# Patient Record
Sex: Male | Born: 1965 | Race: White | Hispanic: No | Marital: Married | Smoking: Former smoker
Health system: Southern US, Community
[De-identification: ages and names within clinical notes are randomized; demographics above are authoritative.]

## PROBLEM LIST (undated history)

## (undated) DIAGNOSIS — F17200 Nicotine dependence, unspecified, uncomplicated: Secondary | ICD-10-CM

## (undated) DIAGNOSIS — Q600 Renal agenesis, unilateral: Secondary | ICD-10-CM

## (undated) DIAGNOSIS — I10 Essential (primary) hypertension: Secondary | ICD-10-CM

## (undated) DIAGNOSIS — K4090 Unilateral inguinal hernia, without obstruction or gangrene, not specified as recurrent: Secondary | ICD-10-CM

## (undated) DIAGNOSIS — R7303 Prediabetes: Secondary | ICD-10-CM

## (undated) DIAGNOSIS — J4520 Mild intermittent asthma, uncomplicated: Secondary | ICD-10-CM

## (undated) DIAGNOSIS — F1020 Alcohol dependence, uncomplicated: Secondary | ICD-10-CM

## (undated) DIAGNOSIS — K760 Fatty (change of) liver, not elsewhere classified: Secondary | ICD-10-CM

## (undated) DIAGNOSIS — K219 Gastro-esophageal reflux disease without esophagitis: Secondary | ICD-10-CM

## (undated) DIAGNOSIS — F1094 Alcohol use, unspecified with alcohol-induced mood disorder: Secondary | ICD-10-CM

## (undated) DIAGNOSIS — E782 Mixed hyperlipidemia: Secondary | ICD-10-CM

---

## 2007-12-13 ENCOUNTER — Ambulatory Visit: Payer: Self-pay | Admitting: Family Medicine

## 2008-01-25 ENCOUNTER — Ambulatory Visit: Payer: Self-pay | Admitting: Internal Medicine

## 2008-02-25 ENCOUNTER — Ambulatory Visit: Payer: Self-pay | Admitting: Family Medicine

## 2008-03-20 ENCOUNTER — Emergency Department: Payer: Self-pay | Admitting: Emergency Medicine

## 2010-12-11 ENCOUNTER — Ambulatory Visit: Payer: Self-pay | Admitting: Gastroenterology

## 2011-01-15 ENCOUNTER — Ambulatory Visit: Payer: Self-pay | Admitting: Family Medicine

## 2011-06-11 ENCOUNTER — Ambulatory Visit: Payer: Self-pay | Admitting: Gastroenterology

## 2011-07-21 ENCOUNTER — Ambulatory Visit: Payer: Self-pay | Admitting: Gastroenterology

## 2011-07-24 LAB — PATHOLOGY REPORT

## 2011-09-08 ENCOUNTER — Ambulatory Visit: Payer: Self-pay

## 2011-11-28 IMAGING — US ABDOMEN ULTRASOUND
1 series · 17 of 25 positions shown · non-contrast
Comparison: none

REASON FOR EXAM: elevated LFT
COMMENTS:

[Series 1: abdomen ultrasound · 17 of 62 slices shown]
[im 1/62]
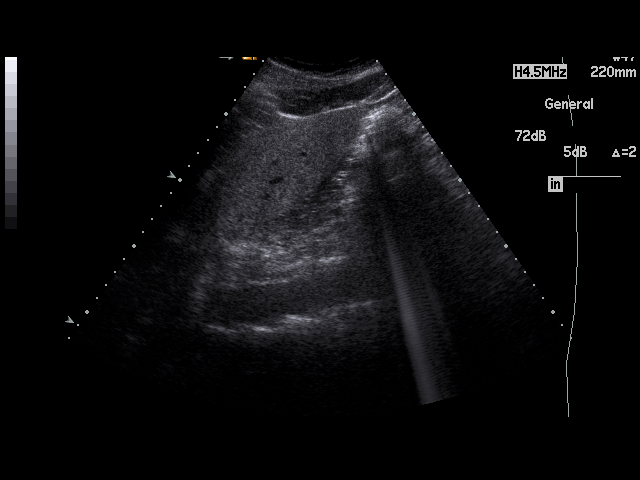
[im 6/62]
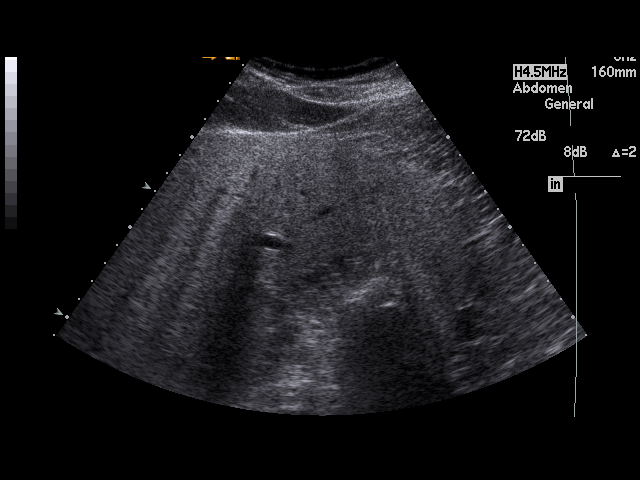
[im 8/62]
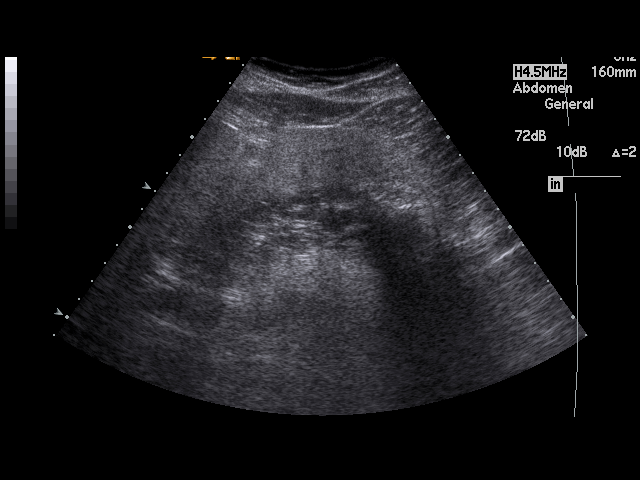
[im 13/62]
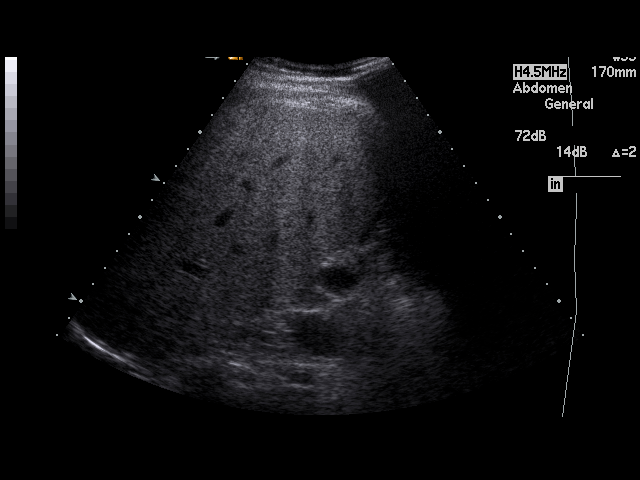
[im 16/62]
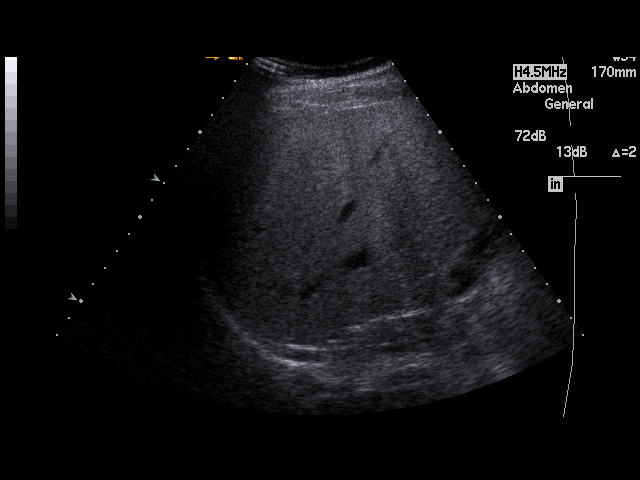
[im 21/62]
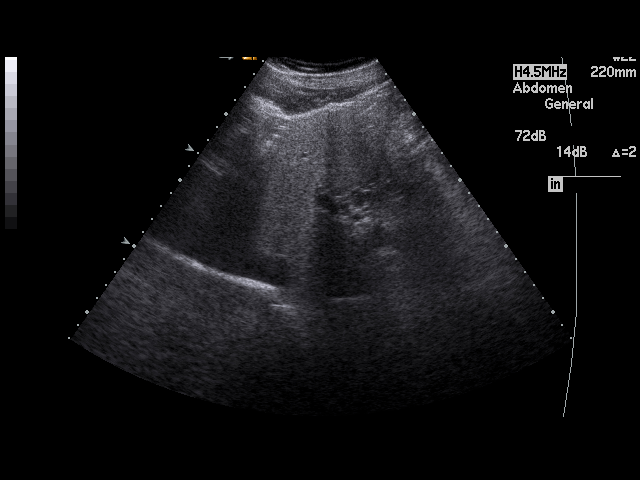
[im 23/62]
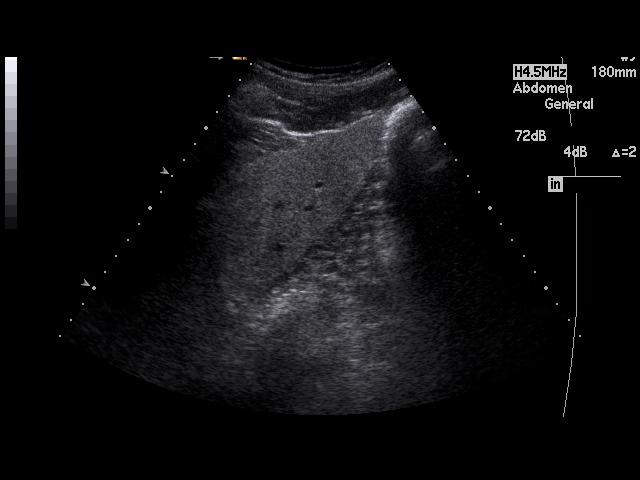
[im 28/62]
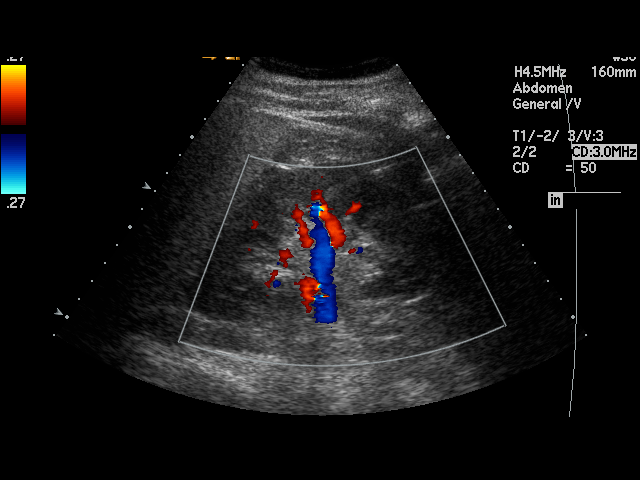
[im 31/62]
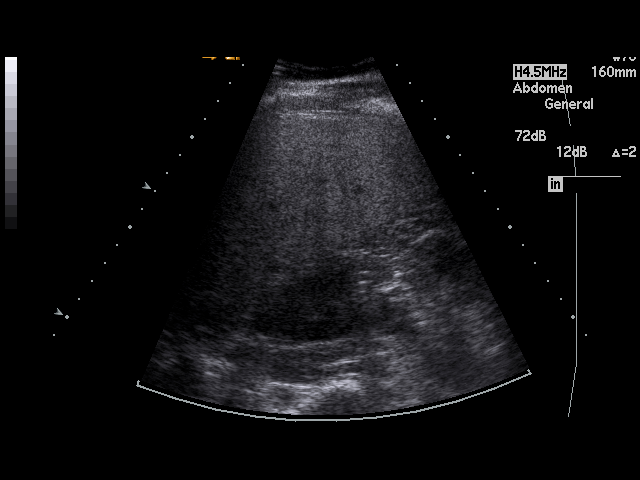
[im 34/62]
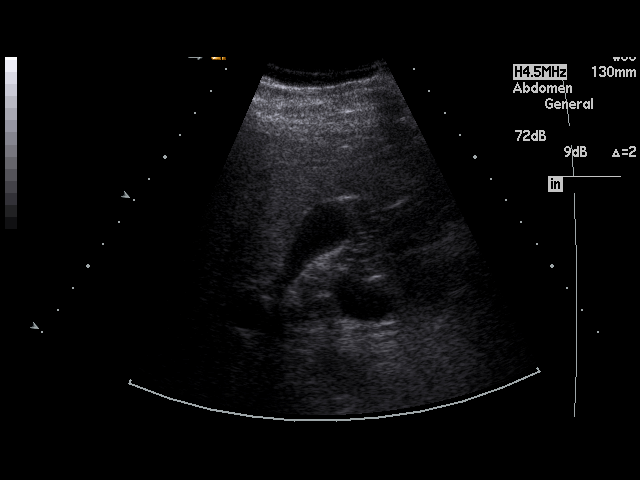
[im 39/62]
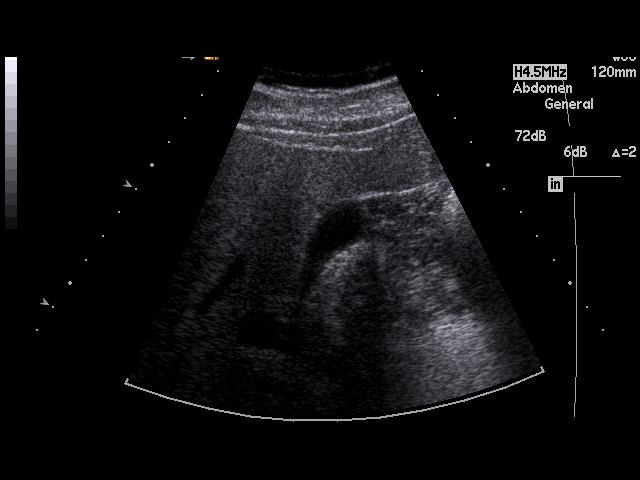
[im 41/62]
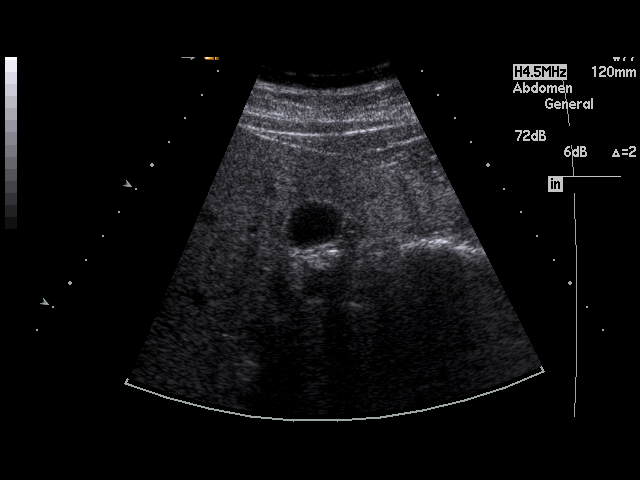
[im 46/62]
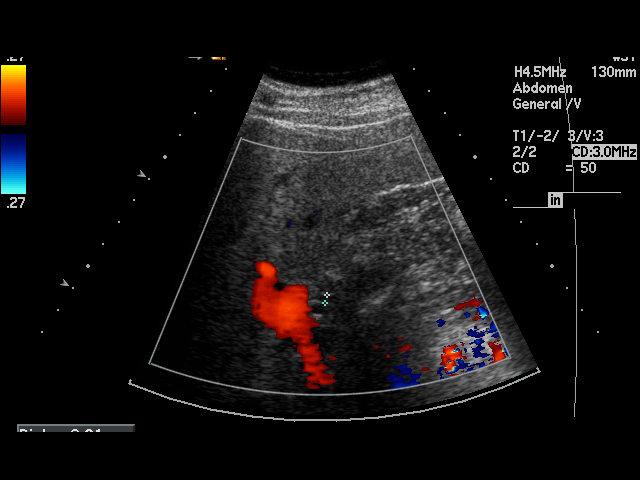
[im 49/62]
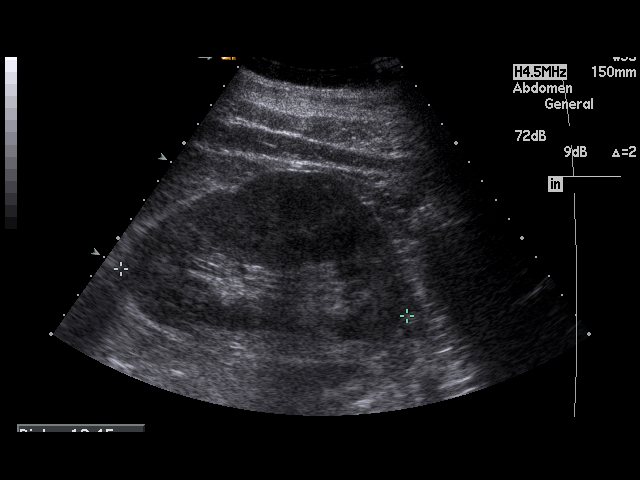
[im 54/62]
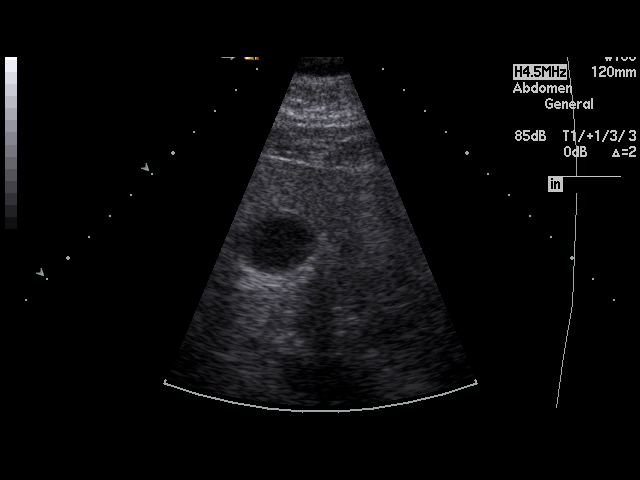
[im 56/62]
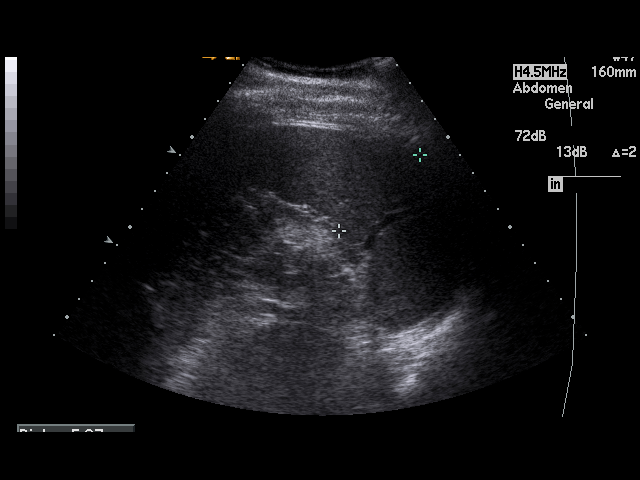
[im 62/62]
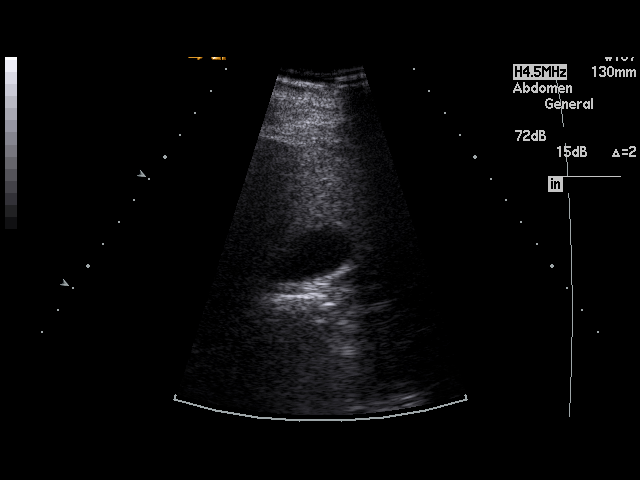

[17 of 25 positions shown; findings below may reference images not displayed]

PROCEDURE:     QUIRIJN - QUIRIJN ABDOMEN GENERAL SURVEY  - December 11, 2010 [DATE]

RESULT:     The liver is hyperechogenic, consistent with fatty infiltration.
No focal hepatic mass lesions are identified. The pancreas is not visualized
adequately for evaluation on this exam. The abdominal aorta and inferior
vena cava show no significant abnormalities. No gallstones are seen. There
is no thickening of the gallbladder wall. The common bile duct measures
mm in diameter which is within normal limits. The right kidney is visualized
and measures 12.79 cm sagittally. No hydronephrosis or other significant
abnormality of the right kidney is seen. The left kidney is not visualized
and reportedly is congenitally absent.
IMPRESSION: 1. No gallstones or other acute change is identified.
2. The left kidney is absent.

## 2012-01-02 IMAGING — CR DG SHOULDER 3+V*L*
1 series · 3 of 3 positions shown · non-contrast
Comparison: none

REASON FOR EXAM: PAINFUL
COMMENTS:

PROCEDURE:     MDR - MDR SHOULDER LEFT COMPLETE  - January 15, 2011 [DATE]
RESULT:     No fracture, dislocation or other acute bony abnormality is
identified. No lytic or blastic lesions are seen. No soft tissue
calcification is identified about the humeral head.

[Series 1: view not recorded · 0.17mm/px · 3 of 3 slices shown]
[im 1/3]
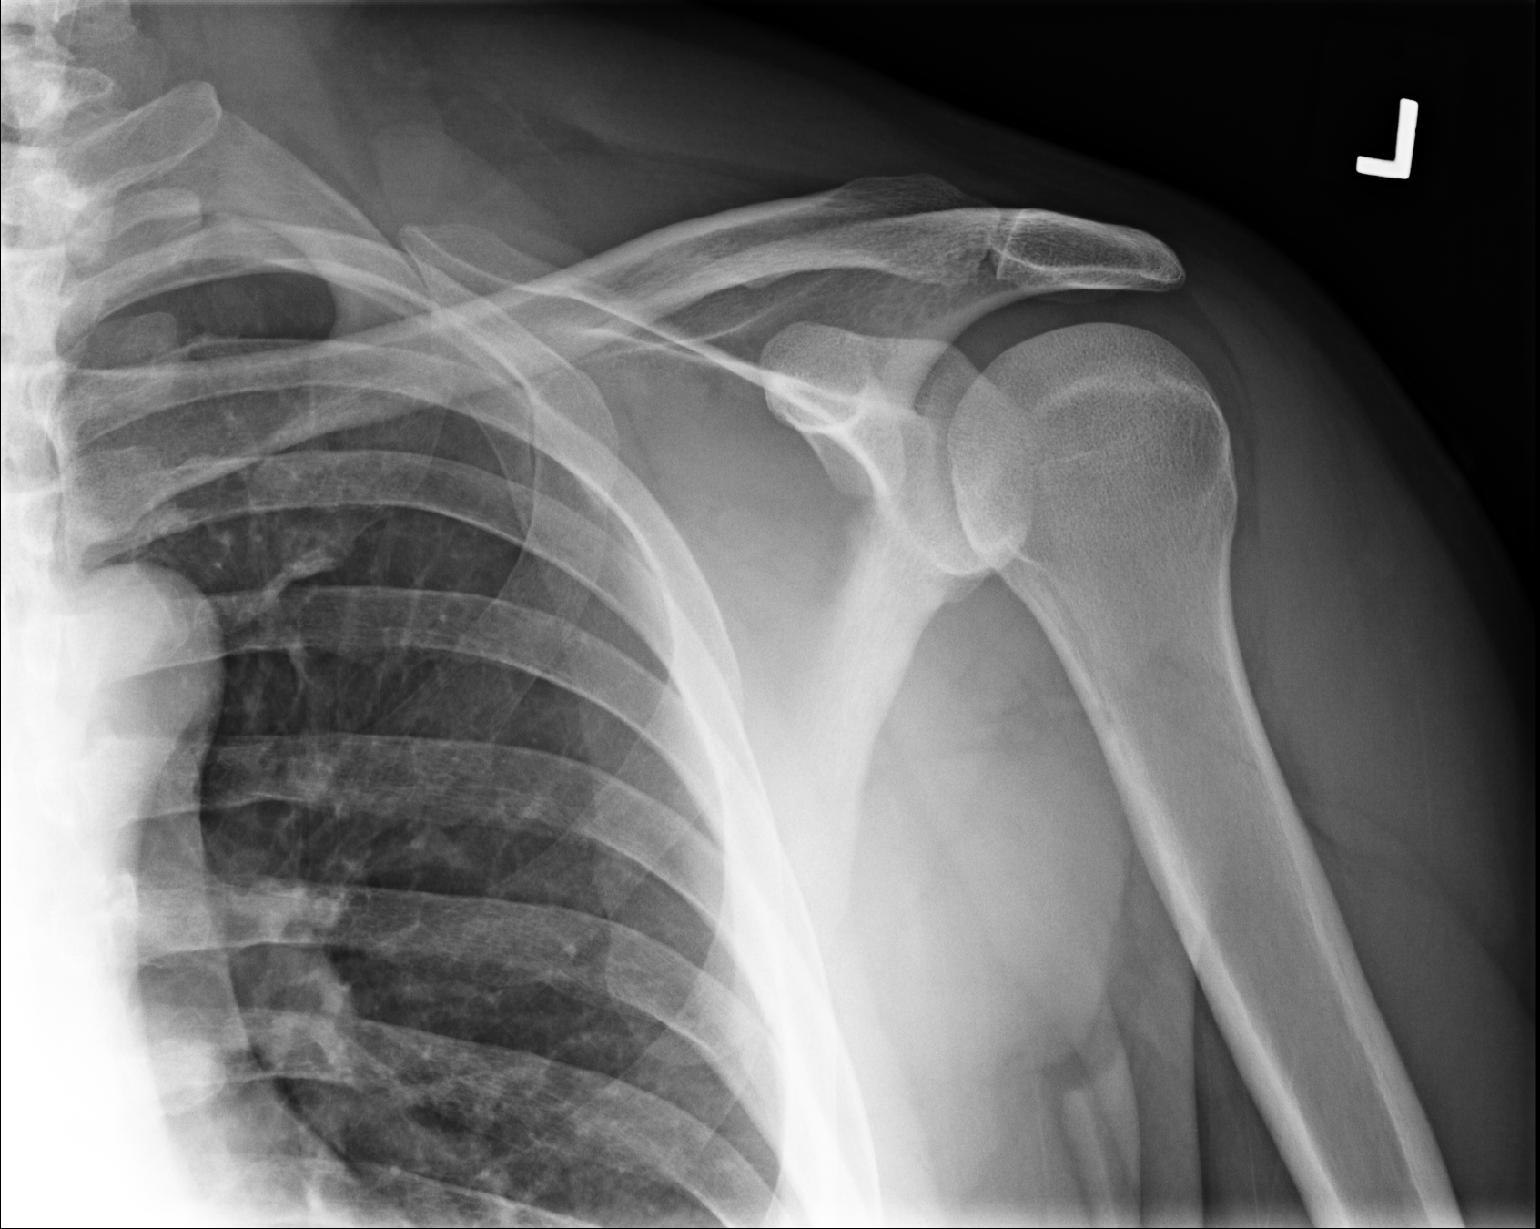
[im 2/3]
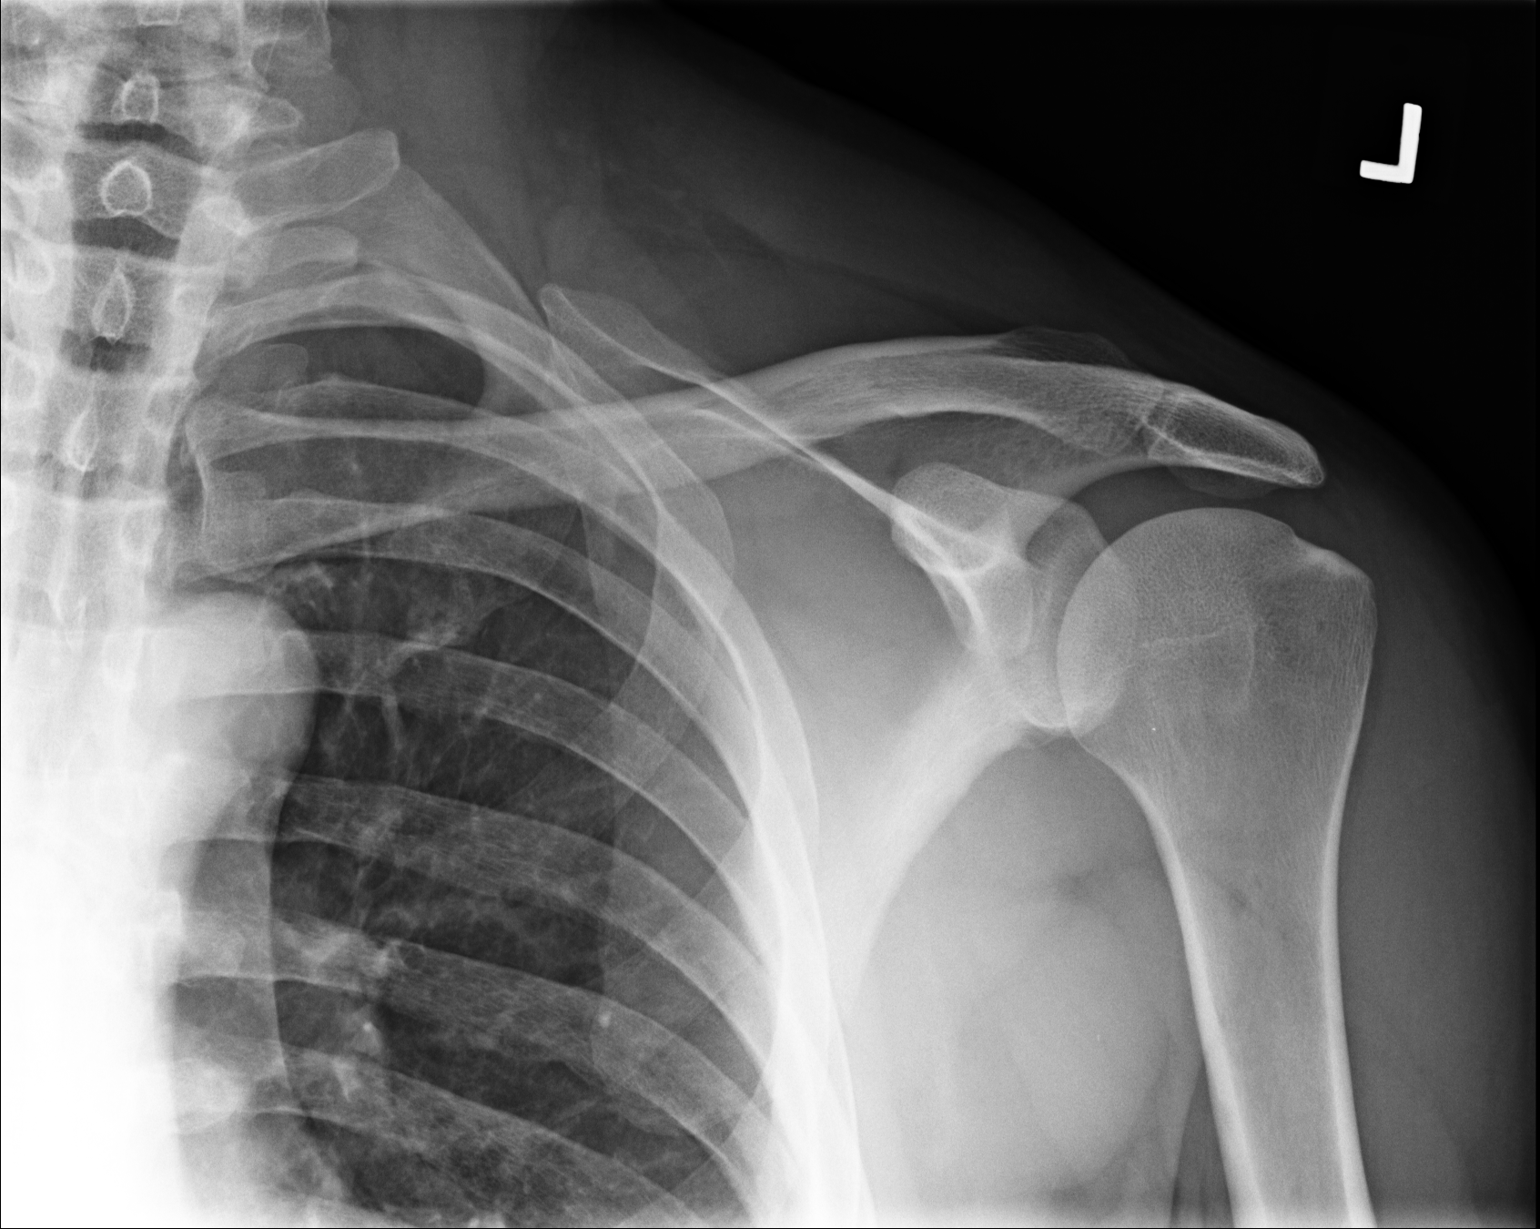
[im 3/3]
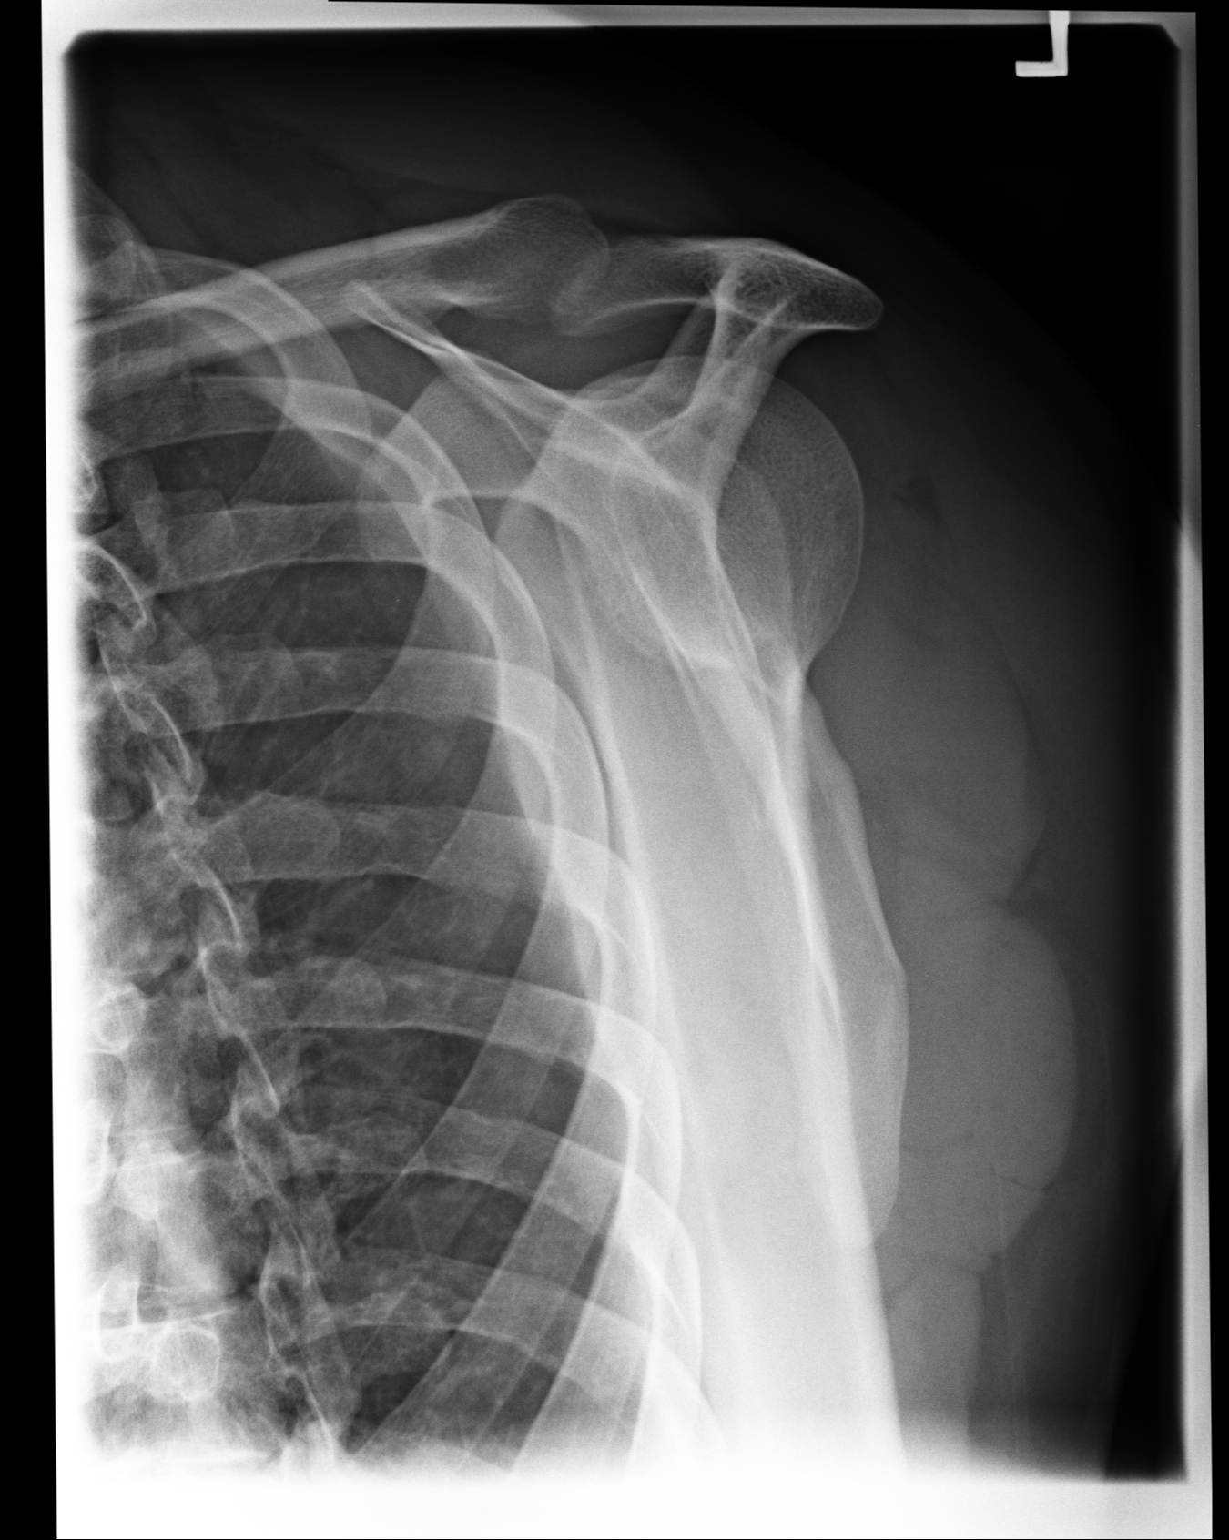

[3 of 3 positions shown; findings below may reference images not displayed]

IMPRESSION: No significant abnormalities are noted.

## 2012-05-28 IMAGING — US ABDOMEN ULTRASOUND
1 series · 14 of 25 positions shown · non-contrast
Comparison: none

REASON FOR EXAM: RUQ abd pain
COMMENTS:

[Series 1: abdomen ultrasound · 0.35mm/px · 14 of 64 slices shown]
[im 1/64]
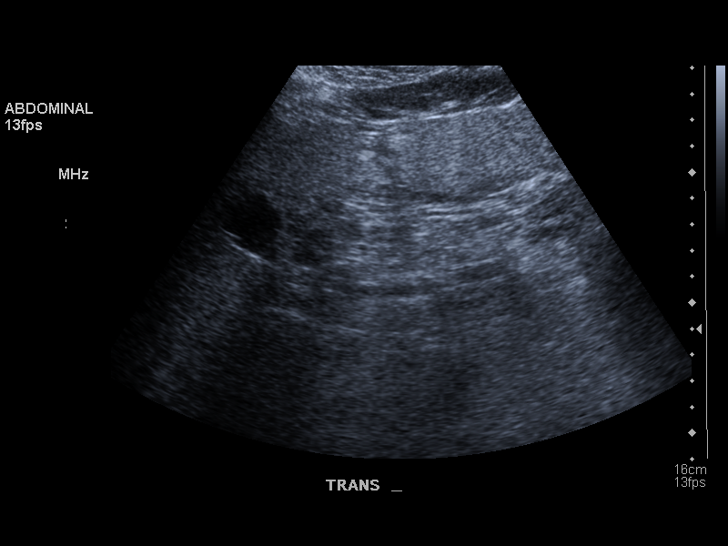
[im 6/64]
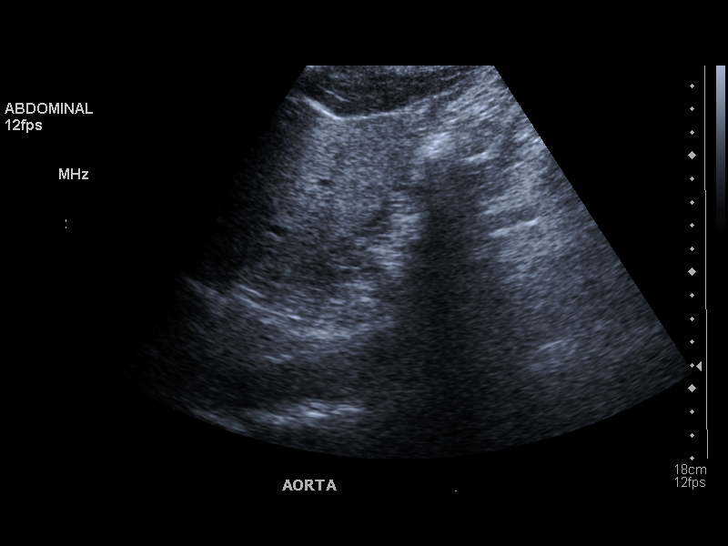
[im 11/64]
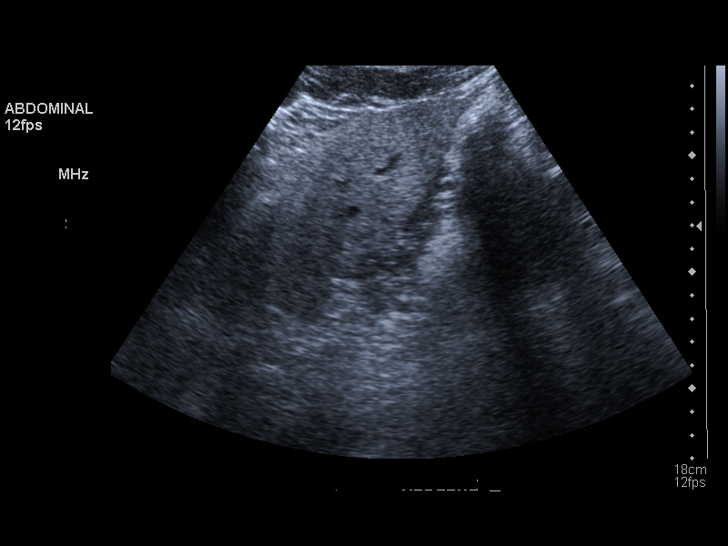
[im 16/64]
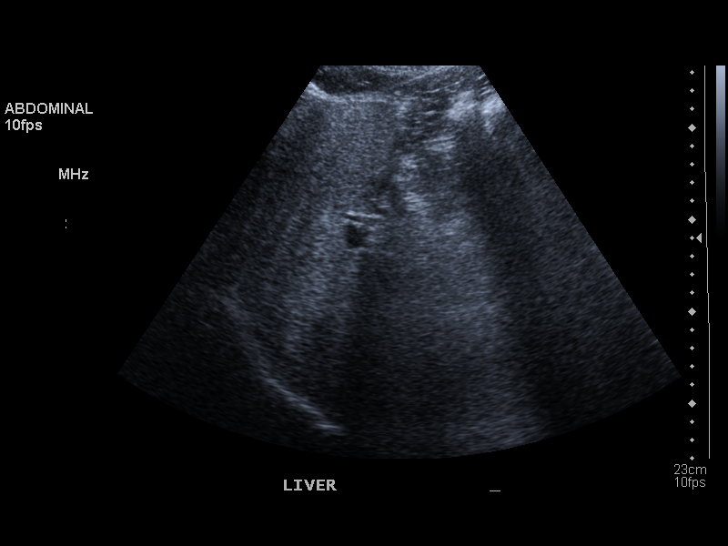
[im 22/64]
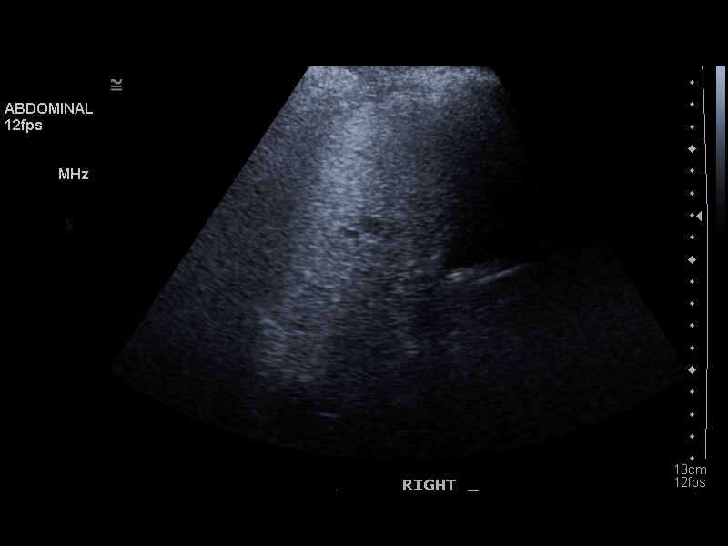
[im 24/64]
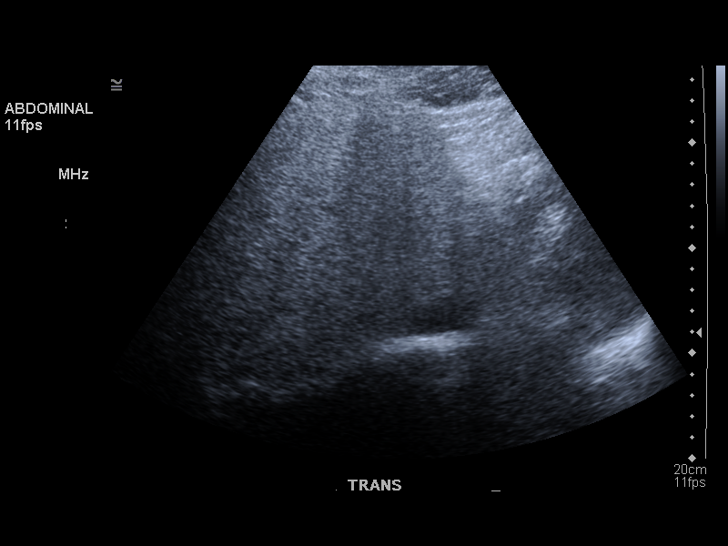
[im 29/64]
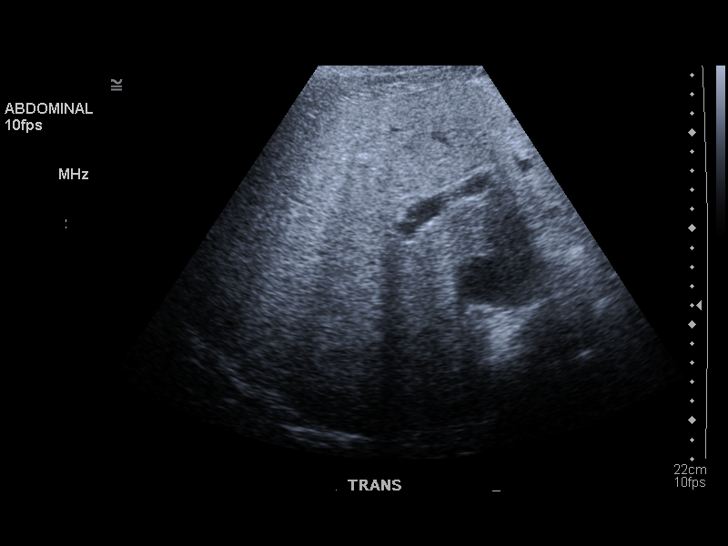
[im 35/64]
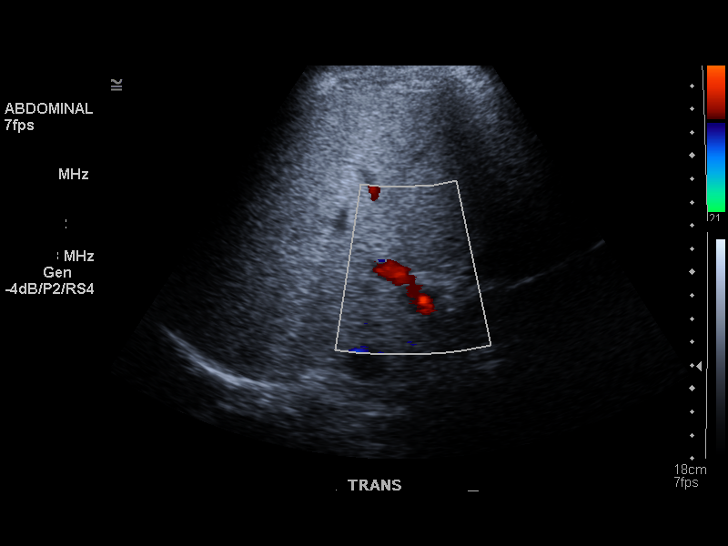
[im 40/64]
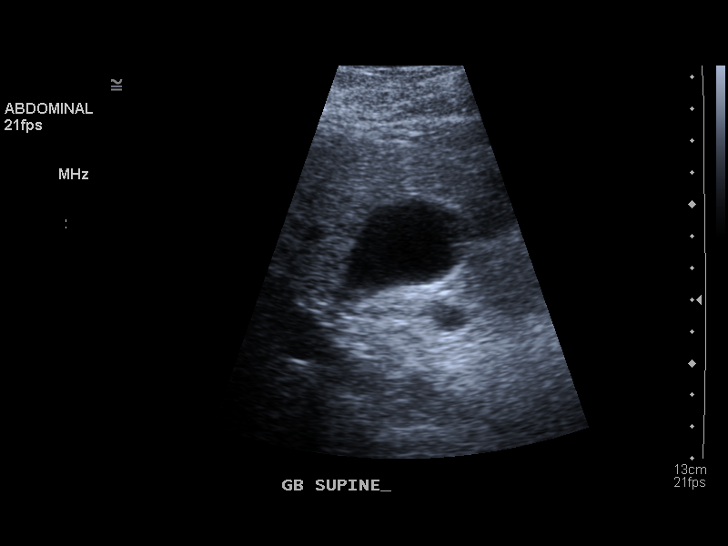
[im 43/64]
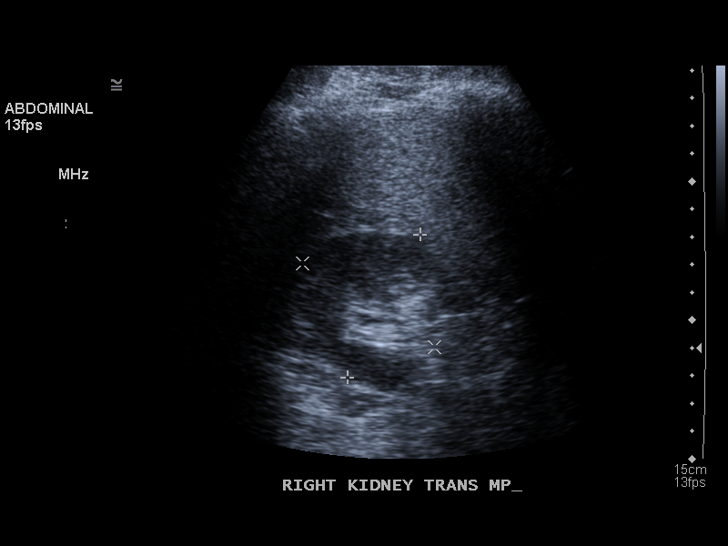
[im 48/64]
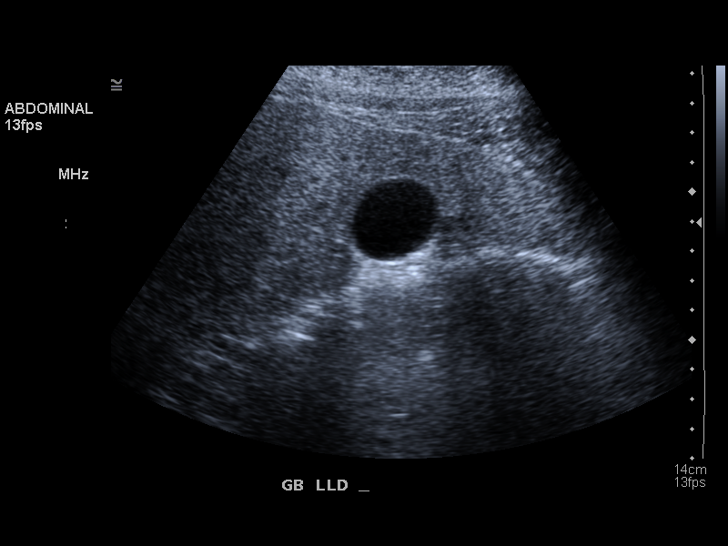
[im 53/64]
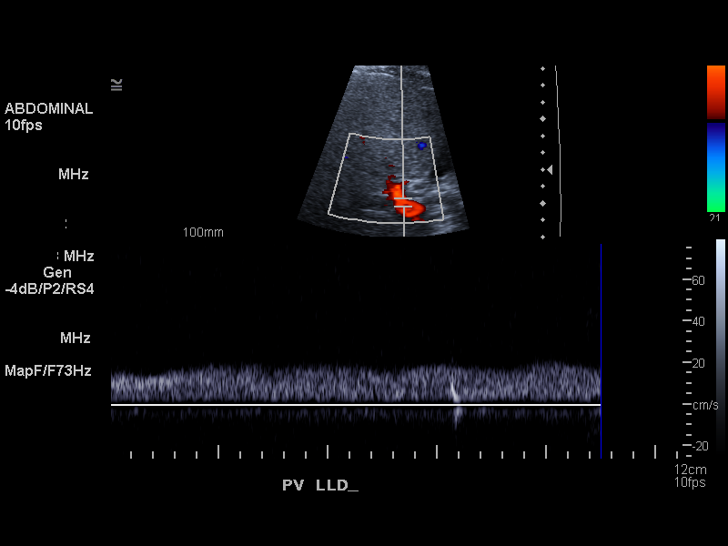
[im 58/64]
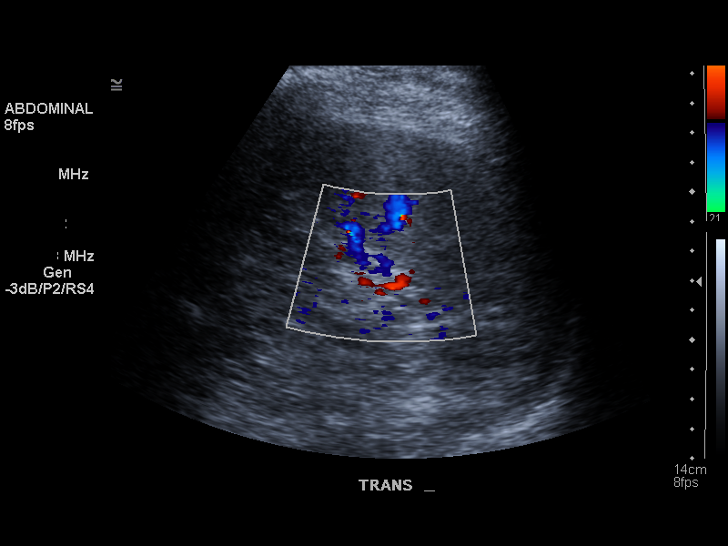
[im 64/64]
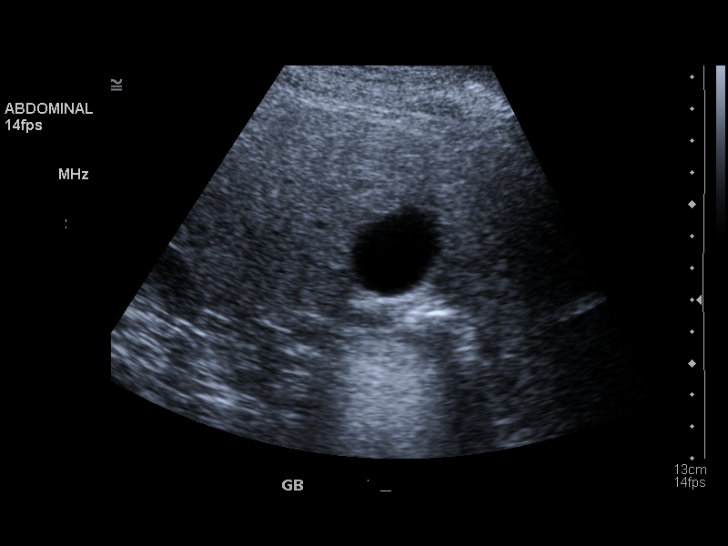

[14 of 25 positions shown; findings below may reference images not displayed]

PROCEDURE:     LEOONIT - LEOONIT ABDOMEN GENERAL SURVEY  - June 11, 2011  [DATE]

RESULT:     Abdominal Sonogram demonstrates limited visualization of the
pancreas without a gross abnormality. There is increased echotexture of the
liver consistent with diffuse fatty infiltration. The abdominal aorta
demonstrates normal caliber in the included portions. The inferior vena cava
appears patent. No stones are seen within the gallbladder. There is no
sonographic Murphy's sign or pericholecystic fluid. The gallbladder wall
thickness is 2.3 mm. The common bile duct diameter is 3.5 mm. The right
kidney measures 13.3 x 5.8 x 5.6 cm. The left kidney is not visualized
consistent with congenital absence. The spleen appears unremarkable.
IMPRESSION: 1.  Congenital absence of the left kidney.
2.  Poor visualization of the pancreas.
3.  Hepatic steatosis.

## 2016-08-04 DIAGNOSIS — Q600 Renal agenesis, unilateral: Secondary | ICD-10-CM | POA: Insufficient documentation

## 2016-08-04 DIAGNOSIS — K219 Gastro-esophageal reflux disease without esophagitis: Secondary | ICD-10-CM | POA: Insufficient documentation

## 2016-08-12 DIAGNOSIS — E782 Mixed hyperlipidemia: Secondary | ICD-10-CM | POA: Insufficient documentation

## 2016-08-12 DIAGNOSIS — K760 Fatty (change of) liver, not elsewhere classified: Secondary | ICD-10-CM | POA: Insufficient documentation

## 2016-08-12 DIAGNOSIS — I10 Essential (primary) hypertension: Secondary | ICD-10-CM | POA: Insufficient documentation

## 2019-08-10 ENCOUNTER — Other Ambulatory Visit: Payer: Self-pay

## 2019-08-10 ENCOUNTER — Emergency Department
Admission: EM | Admit: 2019-08-10 | Discharge: 2019-08-10 | Disposition: A | Discharge status: Home / Self Care | Payer: 59 | Attending: Emergency Medicine | Admitting: Emergency Medicine

## 2019-08-10 ENCOUNTER — Encounter: Payer: Self-pay | Admitting: Medical Oncology

## 2019-08-10 DIAGNOSIS — F1094 Alcohol use, unspecified with alcohol-induced mood disorder: Secondary | ICD-10-CM | POA: Insufficient documentation

## 2019-08-10 DIAGNOSIS — F1120 Opioid dependence, uncomplicated: Secondary | ICD-10-CM | POA: Insufficient documentation

## 2019-08-10 DIAGNOSIS — F1024 Alcohol dependence with alcohol-induced mood disorder: Secondary | ICD-10-CM | POA: Insufficient documentation

## 2019-08-10 DIAGNOSIS — I10 Essential (primary) hypertension: Secondary | ICD-10-CM | POA: Diagnosis not present

## 2019-08-10 DIAGNOSIS — Z046 Encounter for general psychiatric examination, requested by authority: Secondary | ICD-10-CM | POA: Diagnosis present

## 2019-08-10 HISTORY — DX: Essential (primary) hypertension: I10

## 2019-08-10 LAB — ETHANOL: Alcohol, Ethyl (B): 289 mg/dL — ABNORMAL HIGH (ref <10)

## 2019-08-10 LAB — CBC
HCT: 44.6 % (ref 39.0–52.0)
Hemoglobin: 15.4 g/dL (ref 13.0–17.0)
MCH: 32.6 pg (ref 26.0–34.0)
MCHC: 34.5 g/dL (ref 30.0–36.0)
MCV: 94.5 fL (ref 80.0–100.0)
Platelets: 184 10*3/uL (ref 150–400)
RBC: 4.72 MIL/uL (ref 4.22–5.81)
RDW: 14.1 % (ref 11.5–15.5)
WBC: 4 10*3/uL (ref 4.0–10.5)
nRBC: 0 % (ref 0.0–0.2)

## 2019-08-10 LAB — COMPREHENSIVE METABOLIC PANEL
ALT: 41 U/L (ref 0–44)
AST: 78 U/L — ABNORMAL HIGH (ref 15–41)
Albumin: 4.4 g/dL (ref 3.5–5.0)
Alkaline Phosphatase: 100 U/L (ref 38–126)
Anion gap: 12 (ref 5–15)
BUN: 15 mg/dL (ref 6–20)
CO2: 24 mmol/L (ref 22–32)
Calcium: 9 mg/dL (ref 8.9–10.3)
Chloride: 99 mmol/L (ref 98–111)
Creatinine, Ser: 1.22 mg/dL (ref 0.61–1.24)
GFR calc Af Amer: >60 mL/min (ref >60)
GFR calc non Af Amer: >60 mL/min (ref >60)
Glucose, Bld: 137 mg/dL — ABNORMAL HIGH (ref 70–99)
Potassium: 4 mmol/L (ref 3.5–5.1)
Sodium: 135 mmol/L (ref 135–145)
Total Bilirubin: 1 mg/dL (ref 0.3–1.2)
Total Protein: 8.9 g/dL — ABNORMAL HIGH (ref 6.5–8.1)

## 2019-08-10 LAB — ACETAMINOPHEN LEVEL: Acetaminophen (Tylenol), Serum: <10 ug/mL — ABNORMAL LOW (ref 10–30)

## 2019-08-10 LAB — SALICYLATE LEVEL: Salicylate Lvl: <7 mg/dL — ABNORMAL LOW (ref 7.0–30.0)

## 2019-08-10 MED ORDER — DIAZEPAM 5 MG PO TABS
20.0000 mg | ORAL_TABLET | Freq: Once | ORAL | Status: AC
  Administered 2019-08-10: 11:00:00 20 mg via ORAL
  Filled 2019-08-10: qty 4

## 2019-08-10 NOTE — ED Provider Notes (Signed)
[facility] Emergency Department Provider Note  ____________________________________________  Time seen: Approximately 11:54 AM  I have reviewed the triage vital signs and the nursing notes.   HISTORY  Chief Complaint Psychiatric Evaluation    HPI Todd Mason is a 54 y.o. male with a history of hypertension, opioid abuse and dependence, alcohol abuse and dependence.   Comes the ED today because of feeling depressed, having crying episodes, drinking heavily over the past several weeks.  His wife told him that when he gets blackout drunk, he make statements and gestures indicating that he wants to kill himself.  He denies any suicidal thoughts currently and states he normally does not feel this way when he is not intoxicated.  Denies HI or hallucinations or prior suicide attempt.  States that his wife is taken away his guns.     Past Medical History:  Diagnosis Date  . Hypertension      Patient Active Problem List   Diagnosis Date Noted  . Alcohol-induced mood disorder (HCC)         Prior to Admission medications   Not on File  None   Allergies Patient has no known allergies.   No family history on file.  Social History Social History   Tobacco Use  . Smoking status: Not on file  Substance Use Topics  . Alcohol use: Not on file  . Drug use: Not on file  Daily alcohol use.  Last alcohol this morning. Occasional recreational use of Percocet  Review of Systems  Constitutional:   No fever or chills.  ENT:   No sore throat. No rhinorrhea. Cardiovascular:   No chest pain or syncope. Respiratory:   No dyspnea or cough. Gastrointestinal:   Negative for abdominal pain, vomiting and diarrhea.  Musculoskeletal:   Negative for focal pain or swelling All other systems reviewed and are negative except as documented above in ROS and HPI.  ____________________________________________   PHYSICAL EXAM:  VITAL SIGNS: ED Triage Vitals   Enc Vitals Group     BP 08/10/19 0820 (!) 127/95     Pulse Rate 08/10/19 0820 79     Resp 08/10/19 0820 18     Temp 08/10/19 0820 (!) 97.5 F (36.4 C)     Temp Source 08/10/19 0820 Oral     SpO2 08/10/19 0820 99 %     Weight 08/10/19 0822 210 lb (95.3 kg)     Height 08/10/19 0822 6' (1.829 m)     Head Circumference --      Peak Flow --      Pain Score 08/10/19 0821 0     Pain Loc --      Pain Edu? --      Excl. in GC? --     Vital signs reviewed, nursing assessments reviewed.   Constitutional:   Alert and oriented. Non-toxic appearance. Eyes:   Conjunctivae are normal. EOMI. PERRL. ENT      Head:   Normocephalic and atraumatic.      Nose:   Wearing a mask.      Mouth/Throat:   Wearing a mask.      Neck:   No meningismus. Full ROM. Hematological/Lymphatic/Immunilogical:   No cervical lymphadenopathy. Cardiovascular:   RRR. Symmetric bilateral radial and DP pulses.  No murmurs. Cap refill less than 2 seconds. Respiratory:   Normal respiratory effort without tachypnea/retractions. Breath sounds are clear and equal bilaterally. No wheezes/rales/rhonchi.  Musculoskeletal:   Normal range of motion in all extremities.  No joint effusions.  No lower extremity tenderness.  No edema. Neurologic:   Normal speech and language. Ambulatory, steady gait Motor grossly intact. No acute focal neurologic deficits are appreciated.  Skin:    Skin is warm, dry and intact. No rash noted.  No petechiae, purpura, or bullae.  ____________________________________________    LABS (pertinent positives/negatives) (all labs ordered are listed, but only abnormal results are displayed) Labs Reviewed  COMPREHENSIVE METABOLIC PANEL - Abnormal; Notable for the following components:      Result Value   Glucose, Bld 137 (*)    Total Protein 8.9 (*)    AST 78 (*)    All other components within normal limits  ETHANOL - Abnormal; Notable for the following components:   Alcohol, Ethyl (B) 289 (*)    All  other components within normal limits  SALICYLATE LEVEL - Abnormal; Notable for the following components:   Salicylate Lvl <2.4 (*)    All other components within normal limits  ACETAMINOPHEN LEVEL - Abnormal; Notable for the following components:   Acetaminophen (Tylenol), Serum <10 (*)    All other components within normal limits  CBC  URINE DRUG SCREEN, QUALITATIVE ([facility] ONLY)   ____________________________________________   EKG    ____________________________________________    RADIOLOGY  No results found.  ____________________________________________   PROCEDURES Procedures  ____________________________________________   CLINICAL IMPRESSION / ASSESSMENT AND PLAN / ED COURSE  Medications ordered in the ED: Medications  diazepam (VALIUM) tablet 20 mg (20 mg Oral Given 08/10/19 1036)    Pertinent labs & imaging results that were available during my care of the patient were reviewed by me and considered in my medical decision making (see chart for details).  Todd Mason was evaluated in Emergency Department on 08/10/2019 for the symptoms described in the history of present illness. He was evaluated in the context of the global COVID-19 pandemic, which necessitated consideration that the patient might be at risk for infection with the SARS-CoV-2 virus that causes COVID-19. Institutional protocols and algorithms that pertain to the evaluation of patients at risk for COVID-19 are in a state of rapid change based on information released by regulatory bodies including the CDC and federal and state organizations. These policies and algorithms were followed during the patient's care in the ED.   Patient presents with symptoms of depression.  Not an imminent danger to himself or others.  Not committable.  Discussed with psychiatry after their evaluation of the patient where they also agree that he does not require hospitalization at this time.  Offered patient hospitalization or  referral for alcohol detox which he declines at this time as he is not yet ready to quit drinking.  Counseled him on complete abstinence from opioids to avoid precipitating relapse of his prior opioid dependence.  I plan to give referral information for substance abuse counseling and treatment as well as a prescription for Librium to help him with alcohol withdrawal symptoms should he tried to cut back.  However, patient eloped before this could be arranged on his behalf.  He is medically stable, he has medical decision-making capacity, is not currently intoxicated.     ____________________________________________   FINAL CLINICAL IMPRESSION(S) / ED DIAGNOSES    Final diagnoses:  Alcohol dependence with alcohol-induced mood disorder [facility])     ED Discharge Orders    None      Portions of this note were generated with dragon dictation software. Dictation errors may occur despite best attempts at proofreading.  Sharman Cheek, MD 08/10/19 1158

## 2019-08-10 NOTE — ED Notes (Signed)
Pt appears to left the building. Pt was waiting on his wife to come pick him up. Pt V/S WNL.

## 2019-08-10 NOTE — ED Notes (Addendum)
Pt dressed out into appropriate behavioral health clothing with this tech and [employer] in the rm. Pt belongings consist of a blue/gray hat, a blue shirt, a pair of blue jeans, brown boots, a camouflage jacket, a silver Samsung cell phone, a white cell phone charger, a pack of matches, a can of chewing tobacco, a pack of crackers, a pair of black sunglasses, a brown belt, gray boxers and white socks. Pt has a brown wallet with $13 inside. Pt has a bottle of Amlodipine, Carvedilol and Atorvastatin in his belongings. Pt was calm and cooperative while dressing out. Pt has five lose pills that was placed inside bottle of Amlodipine. Pt stated "these are my stomach pills." Pt belongings placed into of two pt belongings bags and labeled with pt name.

## 2019-08-10 NOTE — Consult Note (Signed)
[facility] Psychiat[facility] Consult   Reason for Consult: Depression and drinking problem Referring Physician: Dr. Joni Fears Patient Identification: Todd Mason MRN:  [id number] Principal Diagnosis: <principal problem not specified> Diagnosis:  Active Problems:   * No active hospital problems. *   Total Time spent with patient: 30 minutes  Subjective:   Todd Mason is a 54 y.o. male patient who presented with depression and EtOH abuse.Marland Kitchen  HPI: Patient reports feeling depressed coming to the ED for his drinking problem.  He reports he has been drinking for approximately 6 months since March.  He lists the pandemic as a source of stress that started his drinking as well as financial problems.  He states that he drinks every day several beers, a couple glasses of wine, and recently is also been drinking a pint of liquor from the liquor store.  Patient states that he finds himself drinking to address his underlying anxiety.  He denies feeling depressed this morning but reports that he has been making suicidal threats when blackout drunk according to his wife.  Patient's wife has since taken away his pistol.  Patient states that he would never treating her himself as he has no history of doing such in the past, and he has his kids and his family and his parents which he lives for.  At this point patient was offered options on to how he can go about dealing with his substance abuse problem.  It is unclear at this time if patient is wanting to stop drinking.  He was explained that all of the substance abuse places would require him to voluntarily want to be there.  Patient is unsure at this time what treatment he would like, he will be offered outpatient substance abuse and mental health resources  Past Psychiatric History: Patient denies any previous psychiatric hospitalizations.  He denies any previous suicide attempts.  He was in a 28-day treatment program approximately 15 years ago for opiate  use  Risk to Self:  No Risk to Others:  No Prior Inpatient Therapy:  No Prior Outpatient Therapy:  No  Past Medical History:  Past Medical History:  Diagnosis Date  . Hypertension     Family History: No family history on file. Family Psychiatric  History: Denies Social History:  Social History   Substance and Sexual Activity  Alcohol Use None     Social History   Substance and Sexual Activity  Drug Use Not on file    Social History   Socioeconomic History  . Marital status: Married    Spouse name: Not on file  . Number of children: Not on file  . Years of education: Not on file  . Highest education level: Not on file  Occupational History  . Not on file  Tobacco Use  . Smoking status: Not on file  Substance and Sexual Activity  . Alcohol use: Not on file  . Drug use: Not on file  . Sexual activity: Not on file  Other Topics Concern  . Not on file  Social History Narrative  . Not on file   Social Determinants of Health   Financial Resource Strain:   . Difficulty of Paying Living Expenses: Not on file  Food Insecurity:   . Worried About Charity fundraiser in the Last Year: Not on file  . Ran Out of Food in the Last Year: Not on file  Transportation Needs:   . Lack of Transportation (Medical): Not on file  .  Lack of Transportation (Non-Medical): Not on file  Physical Activity:   . Days of Exercise per Week: Not on file  . Minutes of Exercise per Session: Not on file  Stress:   . Feeling of Stress : Not on file  Social Connections:   . Frequency of Communication with Friends and Family: Not on file  . Frequency of Social Gatherings with Friends and Family: Not on file  . Attends Religious Services: Not on file  . Active Member of Clubs or Organizations: Not on file  . Attends Banker Meetings: Not on file  . Marital Status: Not on file   Additional Social History: Patient reports living with his wife.  He currently has 2 kids.  His adult  parents are still alive.    Allergies:  No Known Allergies  Labs:  Results for orders placed or performed during the hospital encounter of 08/10/19 (from the past 48 hour(s))  Comprehensive metabolic panel     Status: Abnormal   Collection Time: 08/10/19  8:31 AM  Result Value Ref Range   Sodium 135 135 - 145 mmol/L   Potassium 4.0 3.5 - 5.1 mmol/L    Comment: HEMOLYSIS AT THIS LEVEL MAY AFFECT RESULT   Chloride 99 98 - 111 mmol/L   CO2 24 22 - 32 mmol/L   Glucose, Bld 137 (H) 70 - 99 mg/dL   BUN 15 6 - 20 mg/dL   Creatinine, Ser 3.87 0.61 - 1.24 mg/dL   Calcium 9.0 8.9 - 56.4 mg/dL   Total Protein 8.9 (H) 6.5 - 8.1 g/dL   Albumin 4.4 3.5 - 5.0 g/dL   AST 78 (H) 15 - 41 U/L   ALT 41 0 - 44 U/L   Alkaline Phosphatase 100 38 - 126 U/L   Total Bilirubin 1.0 0.3 - 1.2 mg/dL   GFR calc non Af Amer >60 >60 mL/min   GFR calc Af Amer >60 >60 mL/min   Anion gap 12 5 - 15    Comment: Performed at [facility], [address], [place], [place] [address]  Ethanol     Status: Abnormal   Collection Time: 08/10/19  8:31 AM  Result Value Ref Range   Alcohol, Ethyl (B) 289 (H) <10 mg/dL    Comment: (NOTE) Lowest detectable limit for serum alcohol is 10 mg/dL. For medical purposes only. Performed at [facility], [address] [place], [place] [address]   Salicylate level     Status: Abnormal   Collection Time: 08/10/19  8:31 AM  Result Value Ref Range   Salicylate Lvl <7.0 (L) 7.0 - 30.0 mg/dL    Comment: Performed at [facility], [address] [place], [place] [address]  Acetaminophen level     Status: Abnormal   Collection Time: 08/10/19  8:31 AM  Result Value Ref Range   Acetaminophen (Tylenol), Serum <10 (L) 10 - 30 ug/mL    Comment: (NOTE) Therapeutic concentrations vary significantly. A range of 10-30 ug/mL  may be an effective concentration for many patients. However, some  are best treated at concentrations outside of this  range. Acetaminophen concentrations >150 ug/mL at 4 hours after ingestion  and >50 ug/mL at 12 hours after ingestion are often associated with  toxic reactions. Performed at [facility], [address] [place], [place] [address]   cbc     Status: None   Collection Time: 08/10/19  8:31 AM  Result Value Ref Range   WBC 4.0 4.0 -  10.5 K/uL   RBC 4.72 4.22 - 5.81 MIL/uL   Hemoglobin 15.4 13.0 - 17.0 g/dL   HCT 97.6 73.4 - 19.3 %   MCV 94.5 80.0 - 100.0 fL   MCH 32.6 26.0 - 34.0 pg   MCHC 34.5 30.0 - 36.0 g/dL   RDW 79.0 24.0 - 97.3 %   Platelets 184 150 - 400 K/uL   nRBC 0.0 0.0 - 0.2 %    Comment: Performed at [facility], [address] [place], [place] [address]    No current facility-administered medications for this encounter.   No current outpatient medications on file.    Musculoskeletal: Strength & Muscle Tone: within normal limits Gait & Station: normal Patient leans: N/A  Psychiatric Specialty Exam: Physical Exam  Constitutional: He appears well-developed.  Cardiovascular: Normal rate.  Musculoskeletal:        General: Normal range of motion.  Neurological: He is alert.  Psychiatric: He has a normal mood and affect. His behavior is normal. Thought content normal.    Review of Systems  Constitutional: Negative for activity change.  Eyes: Negative for discharge.  Respiratory: Negative for apnea.   Endocrine: Negative for cold intolerance.  Allergic/Immunologic: Negative for environmental allergies.  Psychiatric/Behavioral: Positive for dysphoric mood. Negative for agitation, behavioral problems, confusion, decreased concentration, hallucinations, self-injury, sleep disturbance and suicidal ideas. The patient is not nervous/anxious and is not hyperactive.     Blood pressure (!) 136/91, pulse 78, temperature (!) 97.5 F (36.4 C), temperature source Oral, resp. rate 19, height 6' (1.829 m), weight 95.3 kg, SpO2 100 %.Body mass index is 28.48  kg/m.  General Appearance: Fairly Groomed  Eye Contact:  Fair  Speech:  Clear and Coherent  Volume:  Normal  Mood:  Euthymic  Affect:  Congruent  Thought Process:  Coherent  Orientation:  Full (Time, Place, and Person)  Thought Content:  Logical  Suicidal Thoughts:  No  Homicidal Thoughts:  No  Memory:  Recent;   Fair  Judgement:  Intact  Insight:  Fair  Psychomotor Activity:  Negative  Concentration:  Concentration: Fair  Recall:  [employer] of Knowledge:  Fair  Language:  Fair  Akathisia:  No  Handed:  Right  AIMS (if indicated):     Assets:  Manufacturing systems engineer Physical Health Social Support  ADL's:  Intact  Cognition:  WNL  Sleep:        Treatment Plan Summary: Patient is a 54 year old male with no significant past psychiatric history who presents with complaints of drinking and depression.  Patient has decent insight into his illness to understand that his drinking is causing most of his mood problems.  He however is unclear with himself whether he is ready to stop drinking at this time.  Patient will be offered outpatient resources for substance abuse as well as mental health help. Regarding his safety, patient is future oriented and denying any suicidal ideation currently and has multiple protective factors including a family and children for whom he says he is living for.  Patient will be discharged with resources  Diagnosis: Alcohol induced mood disorder.  Disposition: No evidence of imminent risk to self or others at present.   Patient does not meet criteria for psychiatric inpatient admission. Supportive therapy provided about ongoing stressors. Discussed crisis plan, support from social network, calling 911, coming to the Emergency Department, and calling Suicide Hotline.  Clement Sayres, MD 08/10/2019 10:57 AM

## 2019-08-10 NOTE — ED Notes (Signed)
MD speaking with pt at this time.

## 2019-08-10 NOTE — ED Notes (Signed)
Pt states he is here because he needs help. Pt states recent decrease work and has started drinking heavily. Pt states he drank 2 -12oz beers and 1-24 oz beer today on the way here to the ER. Pt states he doesn't want long term help but does need some help, pt states if he drys out for couple days he thinks he should be good. Pt is calm and cooperative. Pt given milk at this time.

## 2019-08-10 NOTE — ED Notes (Signed)
Psych at bedside.

## 2019-08-10 NOTE — BH Assessment (Signed)
Assessment Note  Todd Mason is an 54 y.o. male who presents to the ER due to his wife having concerns about his mood. Per the patient, his wife says when he's intoxicated make statement that suggest he is going to harm his self. However, patient denies saying anything like that. Patient acknowledges his alcohol use is a problem but reports of having no desire at this time to stop and or reduce his use.  During the interview, the patient was calm, cooperative and pleasant. He was able to provide appropriate answers to the questions. Throughout the interview, the patient denied SI/HI and AV/H.  Per the request of Psych MD, Dr. Claris Gower, writer provided patient with resources for outpatient and inpatient treatment.  Diagnosis: Alcohol Use Disorder  Past Medical History:  Past Medical History:  Diagnosis Date  . Hypertension    Family History: No family history on file.  Social History:  has no history on file for tobacco, alcohol, and drug.  Additional Social History:  Alcohol / Drug Use Pain Medications: See PTA Prescriptions: See PTA Over the Counter: See PTA History of alcohol / drug use?: Yes Longest period of sobriety (when/how long): Unable to quantify Negative Consequences of Use: Personal relationships Substance #1 Name of Substance 1: Alcohol 1 - Last Use / Amount: 08/10/2019  CIWA: CIWA-Ar BP: (!) 136/91 Pulse Rate: 78 Nausea and Vomiting: no nausea and no vomiting Tactile Disturbances: mild itching, pins and needles, burning or numbness Tremor: three Auditory Disturbances: not present Paroxysmal Sweats: no sweat visible Visual Disturbances: very mild sensitivity Anxiety: two Headache, Fullness in Head: none present Agitation: normal activity Orientation and Clouding of Sensorium: oriented and can do serial additions CIWA-Ar Total: 8 COWS:    Allergies: No Known Allergies  Home Medications: (Not in a hospital admission)   OB/GYN Status:  No LMP for male  patient.  General Assessment Data Location of Assessment: [facility] ED TTS Assessment: In system Is this a Tele or Face-to-Face Assessment?: Face-to-Face Is this an Initial Assessment or a Re-assessment for this encounter?: Initial Assessment Language Other than English: No Living Arrangements: Other (Comment)(Private Home) What gender do you identify as?: Male Marital status: Married Pregnancy Status: No Living Arrangements: Spouse/significant other Can pt return to current living arrangement?: Yes Admission Status: Voluntary Is patient capable of signing voluntary admission?: Yes Referral Source: Self/Family/Friend Insurance type: [facility] Choice Care  Medical Screening Exam ([place]) Medical Exam completed: Yes  Crisis Care Plan Living Arrangements: Spouse/significant other Legal Guardian: Other:(Self) Name of Psychiatrist: Reports of none Name of Therapist: Reports of none  Education Status Is patient currently in school?: No Is the patient employed, unemployed or receiving disability?: Employed  Risk to self with the past 6 months Suicidal Ideation: No Has patient been a risk to self within the past 6 months prior to admission? : No Suicidal Intent: No Has patient had any suicidal intent within the past 6 months prior to admission? : No Is patient at risk for suicide?: No Suicidal Plan?: No Has patient had any suicidal plan within the past 6 months prior to admission? : No Access to Means: No What has been your use of drugs/alcohol within the last 12 months?: Alcohol & Opioids Previous Attempts/Gestures: No How many times?: 0 Other Self Harm Risks: Active alcohol abuse Triggers for Past Attempts: None known Intentional Self Injurious Behavior: None Family Suicide History: Unknown Recent stressful life event(s): Other (Comment) Persecutory voices/beliefs?: No Depression: Yes Depression Symptoms: Guilt, Tearfulness Substance abuse history  and/or treatment for  substance abuse?: No Suicide prevention information given to non-admitted patients: Not applicable  Risk to Others within the past 6 months Homicidal Ideation: No Does patient have any lifetime risk of violence toward others beyond the six months prior to admission? : No Thoughts of Harm to Others: No Current Homicidal Intent: No Current Homicidal Plan: No Access to Homicidal Means: No Identified Victim: Reports of none History of harm to others?: No Assessment of Violence: None Noted Violent Behavior Description: Reports of none Does patient have access to weapons?: No Criminal Charges Pending?: No Does patient have a court date: No Is patient on probation?: No  Psychosis Hallucinations: None noted Delusions: None noted  Mental Status Report Appearance/Hygiene: Unremarkable, In scrubs Eye Contact: Fair Motor Activity: Freedom of movement, Unremarkable Speech: Logical/coherent, Unremarkable Level of Consciousness: Alert Mood: Anxious, Sad, Pleasant Affect: Appropriate to circumstance, Anxious, Sad Anxiety Level: Minimal Thought Processes: Coherent, Relevant Judgement: Partial Orientation: Person, Place, Time, Situation, Appropriate for developmental age Obsessive Compulsive Thoughts/Behaviors: None  Cognitive Functioning Concentration: Normal Memory: Recent Intact, Remote Intact Is patient IDD: No Insight: Fair Impulse Control: Fair Appetite: Good Have you had any weight changes? : No Change Sleep: No Change Total Hours of Sleep: 8 Vegetative Symptoms: None  ADLScreening [facility] Assessment Services) Patient's cognitive ability adequate to safely complete daily activities?: Yes Patient able to express need for assistance with ADLs?: Yes Independently performs ADLs?: Yes (appropriate for developmental age)  Prior Inpatient Therapy Prior Inpatient Therapy: Yes Prior Therapy Dates: 2005 Prior Therapy Facilty/Provider(s): [facility] Reason for  Treatment: Alcohol use disorder  Prior Outpatient Therapy Prior Outpatient Therapy: No Does patient have an ACCT team?: No Does patient have Intensive In-House Services?  : No Does patient have [facility] services? : No Does patient have P4CC services?: No  ADL Screening (condition at time of admission) Patient's cognitive ability adequate to safely complete daily activities?: Yes Is the patient deaf or have difficulty hearing?: No Does the patient have difficulty seeing, even when wearing glasses/contacts?: No Does the patient have difficulty concentrating, remembering, or making decisions?: No Patient able to express need for assistance with ADLs?: Yes Does the patient have difficulty dressing or bathing?: No Independently performs ADLs?: Yes (appropriate for developmental age) Does the patient have difficulty walking or climbing stairs?: No Weakness of Legs: None Weakness of Arms/Hands: None  Home Assistive Devices/Equipment Home Assistive Devices/Equipment: None  Therapy Consults (therapy consults require a physician order) PT Evaluation Needed: No OT Evalulation Needed: No SLP Evaluation Needed: No Abuse/Neglect Assessment (Assessment to be complete while patient is alone) Abuse/Neglect Assessment Can Be Completed: Yes Physical Abuse: Denies Verbal Abuse: Denies Sexual Abuse: Denies Exploitation of patient/patient's resources: Denies Self-Neglect: Denies Values / Beliefs Cultural Requests During Hospitalization: None Spiritual Requests During Hospitalization: None Consults Spiritual Care Consult Needed: No Transition of Care Team Consult Needed: No Advance Directives (For Healthcare) Does Patient Have a Medical Advance Directive?: No  Child/Adolescent Assessment Running Away Risk: Denies(Patient is an adult)  Disposition:  Disposition Initial Assessment Completed for this Encounter: Yes  On Site Evaluation by:   Reviewed with Physician:    Lilyan Gilford MS,  LCAS, [facility], NCC Therapeutic Triage Specialist 08/10/2019 6:19 PM

## 2019-08-10 NOTE — ED Triage Notes (Signed)
Pt reports for the past 6 weeks he has been drinking heavily, been feeling depressed. Pt reports he has been told by his wife and kids that when he drinks he falls around and has been telling them he wants to kill himself. 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 Reports that he has been drinking this am since 0500, had a few beers and a couple glasses of wine. Pt reports fall early December may have caused a concussion. Pt states that he has been also taking some percocets but hasn't had any in 3-4 days. Pt states at this moment that he is not feeling suicidal, just feels depressed.

## 2019-10-25 DIAGNOSIS — K4090 Unilateral inguinal hernia, without obstruction or gangrene, not specified as recurrent: Secondary | ICD-10-CM | POA: Insufficient documentation

## 2020-02-23 DIAGNOSIS — J4520 Mild intermittent asthma, uncomplicated: Secondary | ICD-10-CM | POA: Insufficient documentation

## 2021-07-05 DIAGNOSIS — F1020 Alcohol dependence, uncomplicated: Secondary | ICD-10-CM | POA: Insufficient documentation

## 2021-08-29 DIAGNOSIS — F17200 Nicotine dependence, unspecified, uncomplicated: Secondary | ICD-10-CM | POA: Insufficient documentation

## 2022-05-29 DIAGNOSIS — R7303 Prediabetes: Secondary | ICD-10-CM | POA: Insufficient documentation

## 2022-12-24 ENCOUNTER — Ambulatory Visit
Admission: EM | Admit: 2022-12-24 | Discharge: 2022-12-24 | Disposition: A | Discharge status: Home / Self Care | Payer: BC Managed Care – PPO | Attending: Family Medicine | Admitting: Family Medicine

## 2022-12-24 DIAGNOSIS — R0781 Pleurodynia: Secondary | ICD-10-CM | POA: Diagnosis not present

## 2022-12-24 DIAGNOSIS — S2232XD Fracture of one rib, left side, subsequent encounter for fracture with routine healing: Secondary | ICD-10-CM

## 2022-12-24 HISTORY — DX: Mixed hyperlipidemia: E78.2

## 2022-12-24 HISTORY — DX: Alcohol use, unspecified with alcohol-induced mood disorder: F10.94

## 2022-12-24 HISTORY — DX: Alcohol dependence, uncomplicated: F10.20

## 2022-12-24 HISTORY — DX: Fatty (change of) liver, not elsewhere classified: K76.0

## 2022-12-24 HISTORY — DX: Gastro-esophageal reflux disease without esophagitis: K21.9

## 2022-12-24 HISTORY — DX: Prediabetes: R73.03

## 2022-12-24 HISTORY — DX: Nicotine dependence, unspecified, uncomplicated: F17.200

## 2022-12-24 HISTORY — DX: Unilateral inguinal hernia, without obstruction or gangrene, not specified as recurrent: K40.90

## 2022-12-24 HISTORY — DX: Renal agenesis, unilateral: Q60.0

## 2022-12-24 HISTORY — DX: Mild intermittent asthma, uncomplicated: J45.20

## 2022-12-24 MED ORDER — METHOCARBAMOL 500 MG PO TABS: 500.0000 mg | ORAL_TABLET | Freq: Two times a day (BID) | ORAL | 0 refills | Status: AC

## 2022-12-24 NOTE — ED Provider Notes (Signed)
[facility]    CSN: [id number] Arrival date & time: 12/24/22  0931      History   Chief Complaint Chief Complaint  Patient presents with   Rib Pain    HPI  HPI Todd Mason is a 57 y.o. male.   Todd Mason presents for left rib pain. [place]. Has a broken rib.  He ran out of his oxycodone. He has not been able to work due to pain.  He requests refill of his oxycodone. Says he was given a 5 day supply and he ran out. Has been taking Tylenol and Motrin without relief. Denies fever or cough.  No shortness of breath. Uses his incentive spirometer.       Past Medical History:  Diagnosis Date   Alcohol-induced mood disorder (HCC)    Congenital absence of one kidney    GERD without esophagitis    Hepatic steatosis    Hyperlipemia, mixed    Hypertension    Mild intermittent asthma without complication    Prediabetes    Right inguinal hernia    Severe alcohol use disorder (HCC)    Tobacco use disorder     Patient Active Problem List   Diagnosis Date Noted   Prediabetes 05/29/2022   Tobacco use disorder 08/29/2021   Severe alcohol use disorder (HCC) 07/05/2021   Mild intermittent asthma without complication 02/23/2020   Right inguinal hernia 10/25/2019   Alcohol-induced mood disorder (HCC)    Essential hypertension 08/12/2016   Hepatic steatosis 08/12/2016   Hyperlipidemia, mixed 08/12/2016   Congenital absence of one kidney 08/04/2016   Gastroesophageal reflux disease without esophagitis 08/04/2016    History reviewed. No pertinent surgical history.     Home Medications    Prior to Admission medications   Medication Sig Start Date End Date Taking? Authorizing Provider  albuterol (VENTOLIN HFA) 108 (90 Base) MCG/ACT inhaler Inhale into the lungs. 04/04/21  Yes [provider]  amLODipine (NORVASC) 10 MG tablet Take 10 mg by mouth daily.   Yes [provider]  carvedilol (COREG) 12.5 MG tablet Take  by mouth. 11/25/22  Yes [provider]  methocarbamol (ROBAXIN) 500 MG tablet Take 1 tablet (500 mg total) by mouth 2 (two) times daily. 12/24/22  Yes Kash Davie, DO  omeprazole (PRILOSEC) 20 MG capsule Take 20 mg by mouth daily.   Yes [provider]  oxyCODONE (OXY IR/ROXICODONE) 5 MG immediate release tablet Take 5 mg by mouth 4 (four) times daily as needed. 12/15/22  Yes [provider]  rosuvastatin (CRESTOR) 20 MG tablet Take by mouth. 11/25/22 11/25/23 Yes [provider]  traZODone (DESYREL) 50 MG tablet Take 1 tablet by mouth at bedtime. 07/16/21  Yes [provider]    Family History History reviewed. No pertinent family history.  Social History Social History   Tobacco Use   Smoking status: Former    Types: Cigarettes   Smokeless tobacco: Never  Vaping Use   Vaping Use: Never used  Substance Use Topics   Alcohol use: Yes    Comment: Occ   Drug use: Never     Allergies   Patient has no known allergies.   Review of Systems Review of Systems: :negative unless otherwise stated in HPI.      Physical Exam Triage Vital Signs ED Triage Vitals  Enc Vitals Group     BP 12/24/22 1017 126/83     Pulse Rate 12/24/22 1017 64  Resp 12/24/22 1017 16     Temp 12/24/22 1017 97.9 F (36.6 C)     Temp Source 12/24/22 1017 Oral     SpO2 12/24/22 1017 95 %     Weight 12/24/22 1015 200 lb (90.7 kg)     Height 12/24/22 1015 6' (1.829 m)     Head Circumference --      Peak Flow --      Pain Score 12/24/22 1021 8     Pain Loc --      Pain Edu? --      Excl. in GC? --    No data found.  Updated Vital Signs BP 126/83 (BP Location: Right Arm)   Pulse 64   Temp 97.9 F (36.6 C) (Oral)   Resp 16   Ht 6' (1.829 m)   Wt 90.7 kg   SpO2 95%   BMI 27.12 kg/m   Visual Acuity Right Eye Distance:   Left Eye Distance:   Bilateral Distance:    Right Eye Near:   Left Eye Near:    Bilateral Near:     Physical Exam GEN: well  appearing male in no acute distress  CVS: well perfused, regular rate and rhythm  RESP: speaking in full sentences without pause, no respiratory distress, clear bilaterally  MSK: left posterior flank pain without deformity or overlying skin changes   Good active ROM of thoracic but with pain   Sensation intact. Peripheral pulses intact. SKIN: warm, dry, no ecchymosis or erythema   UC Treatments / Results  Labs (all labs ordered are listed, but only abnormal results are displayed) Labs Reviewed - No data to display  EKG   Radiology No results found.   Procedures Procedures (including critical care time)  Medications Ordered in UC Medications - No data to display  Initial Impression / Assessment and Plan / UC Course  I have reviewed the triage vital signs and the nursing notes.  Pertinent labs & imaging results that were available during my care of the patient were reviewed by me and considered in my medical decision making (see chart for details).      Pt is a 57 y.o.  male with history of severe alcohol use disorder present for refill of his oxycodone after falling in the bathroom about a week ago.  Has left sided rib pain and known rib fracture.  Review, patient has a left nondisplaced rib fracture which was performed at a [facility] facility.    On exam, pt has tenderness at posterior left flank. Offered repeat xray to ensure no new displacement but pt declined. States he just need his pain medicine. "I've gotten pain medicine here before."  Explained that he is at least 5 days out from his fracture and this is no longer acute pain.  Recommended Tylenol with Motrin while adding on a muscle relaxer, Lidocaine patches and Voltaren gel as needed.   Pt has significant alcohol abuse history and it would not be good medicine to prescribe an opioid when it is not indicated a this time. Reassurance provided.     Patient to gradually return to normal activities, as tolerated and  continue ordinary activities within the limits permitted by pain. Prescribed muscle relaxer  for pain relief.  Motrin and Tylenol TID PRN. Patient to follow up with his primary care provider, if symptoms do not improve with conservative treatment.  Return and ED precautions given. Understanding voiced. Discussed MDM, treatment plan and plan for follow-up with patient.  Final Clinical Impressions(s) / UC Diagnoses   Final diagnoses:  Rib pain on left side  Closed fracture of one rib of left side with routine healing, subsequent encounter     Discharge Instructions      Follow-up with your primary care provider if your pain does not improve.  Stop by the pharmacy to pick up your muscle relaxers.  Be sure to take Tylenol with Motrin for pain relief.  For additional multimodal pain relief you can use Voltaren gel and lidocaine patches.  Ice and heat can be also a good adjuncts for pain.  Continue using your incentive spirometer as previously provided.  If you develop fever or cough please have a repeat x-ray.      ED Prescriptions     Medication Sig Dispense Auth. Provider   methocarbamol (ROBAXIN) 500 MG tablet Take 1 tablet (500 mg total) by mouth 2 (two) times daily. 20 tablet Katha Cabal, DO      PDMP not reviewed this encounter.   Katha Cabal, DO 12/24/22 1050

## 2022-12-24 NOTE — Discharge Instructions (Addendum)
Follow-up with your primary care provider if your pain does not improve.  Stop by the pharmacy to pick up your muscle relaxers.  Be sure to take Tylenol with Motrin for pain relief.  For additional multimodal pain relief you can use Voltaren gel and lidocaine patches.  Ice and heat can be also a good adjuncts for pain.  Continue using your incentive spirometer as previously provided.  If you develop fever or cough please have a repeat x-ray.

## 2022-12-24 NOTE — ED Triage Notes (Signed)
Pt c/o rib pain x1 wk. States he fell in the shower & landed on edge of tub. Was seen at ED in [place] & had xray of ribs which showed closed fracture of L rib. Was given oxy for 5 days w/relief.

## 2023-12-08 ENCOUNTER — Encounter: Payer: Self-pay | Admitting: Emergency Medicine

## 2023-12-08 ENCOUNTER — Ambulatory Visit
Admission: EM | Admit: 2023-12-08 | Discharge: 2023-12-08 | Disposition: A | Discharge status: Home / Self Care | Attending: Physician Assistant | Admitting: Physician Assistant

## 2023-12-08 DIAGNOSIS — R52 Pain, unspecified: Secondary | ICD-10-CM

## 2023-12-08 DIAGNOSIS — R5383 Other fatigue: Secondary | ICD-10-CM

## 2023-12-08 DIAGNOSIS — W57XXXA Bitten or stung by nonvenomous insect and other nonvenomous arthropods, initial encounter: Secondary | ICD-10-CM | POA: Diagnosis not present

## 2023-12-08 MED ORDER — DOXYCYCLINE HYCLATE 100 MG PO CAPS: 100.0000 mg | ORAL_CAPSULE | Freq: Two times a day (BID) | ORAL | 0 refills | Status: AC

## 2023-12-08 NOTE — ED Triage Notes (Signed)
 Pt removed 3 ticks form his body 3 days ago and now has fatigue and leg soreness.

## 2023-12-08 NOTE — Discharge Instructions (Signed)
-   I am unsure if you have a tickborne illness.  It is too soon to test you as it takes several weeks for your body to develop antibodies to things like [facility] spotted fever and Lyme disease. - Given your concerns, I sent and antibiotic to the pharmacy.  I still suggest you get tick testing in about 6 weeks. - If any other symptoms emerge consider reevaluation.

## 2023-12-08 NOTE — ED Provider Notes (Signed)
 [facility]    CSN: [id number] Arrival date & time: 12/08/23  1145      History   Chief Complaint Chief Complaint  Patient presents with   Tick Removal    HPI Todd Mason is a 58 y.o. male presenting for concerns about legs pain and fatigue following multiple tick bites a few days ago.  He believes the ticks were on him for a couple days before they were removed.  He denies fever, joint swelling or rashes.  No cough, congestion, sore throat, headaches, chest pain, shortness of breath, abdominal pain, vomiting, and diarrhea. Patient reports having similar symptoms 20 to 25 years ago states his PCP gave him and antibiotics and thinks that he may have the same thing again. He says he got better within a couple of days after beginning antibiotics.   HPI  Past Medical History:  Diagnosis Date   Alcohol-induced mood disorder (HCC)    Congenital absence of one kidney    GERD without esophagitis    Hepatic steatosis    Hyperlipemia, mixed    Hypertension    Mild intermittent asthma without complication    Prediabetes    Right inguinal hernia    Severe alcohol use disorder (HCC)    Tobacco use disorder     Patient Active Problem List   Diagnosis Date Noted   Prediabetes 05/29/2022   Tobacco use disorder 08/29/2021   Severe alcohol use disorder (HCC) 07/05/2021   Mild intermittent asthma without complication 02/23/2020   Right inguinal hernia 10/25/2019   Alcohol-induced mood disorder (HCC)    Essential hypertension 08/12/2016   Hepatic steatosis 08/12/2016   Hyperlipidemia, mixed 08/12/2016   Congenital absence of one kidney 08/04/2016   Gastroesophageal reflux disease without esophagitis 08/04/2016    History reviewed. No pertinent surgical history.     Home Medications    Prior to Admission medications   Medication Sig Start Date End Date Taking? Authorizing Provider  doxycycline (VIBRAMYCIN) 100 MG capsule Take 1 capsule (100 mg total) by mouth 2  (two) times daily for 10 days. 12/08/23 12/18/23 Yes Nancy Axon B, PA-C  albuterol (VENTOLIN HFA) 108 (90 Base) MCG/ACT inhaler Inhale into the lungs. 04/04/21   [provider]  amLODipine (NORVASC) 10 MG tablet Take 10 mg by mouth daily.    [provider]  carvedilol (COREG) 12.5 MG tablet Take by mouth. 11/25/22   [provider]  methocarbamol  (ROBAXIN ) 500 MG tablet Take 1 tablet (500 mg total) by mouth 2 (two) times daily. 12/24/22   Brimage, Vondra, DO  omeprazole (PRILOSEC) 20 MG capsule Take 20 mg by mouth daily.    [provider]  rosuvastatin (CRESTOR) 20 MG tablet Take by mouth. 11/25/22 11/25/23  [provider]  traZODone (DESYREL) 50 MG tablet Take 1 tablet by mouth at bedtime. 07/16/21   [provider]    Family History History reviewed. No pertinent family history.  Social History Social History   Tobacco Use   Smoking status: Former    Types: Cigarettes   Smokeless tobacco: Never  Vaping Use   Vaping status: Never Used  Substance Use Topics   Alcohol use: Yes    Comment: Occ   Drug use: Never     Allergies   Patient has no known allergies.   Review of Systems Review of Systems  Constitutional:  Positive for fatigue. Negative for fever.  HENT:  Negative for congestion.   Respiratory:  Negative for cough and shortness  of breath.   Cardiovascular:  Negative for chest pain.  Gastrointestinal:  Negative for abdominal pain, diarrhea, nausea and vomiting.  Musculoskeletal:  Positive for arthralgias. Negative for joint swelling.  Skin:  Negative for color change and rash.  Neurological:  Negative for dizziness, weakness, numbness and headaches.     Physical Exam Triage Vital Signs ED Triage Vitals  Encounter Vitals Group     BP 12/08/23 1257 125/81     Systolic BP Percentile --      Diastolic BP Percentile --      Pulse Rate 12/08/23 1257 73     Resp 12/08/23 1257 16     Temp 12/08/23 1257 97.7 F (36.5  C)     Temp Source 12/08/23 1257 Oral     SpO2 12/08/23 1257 97 %     Weight --      Height --      Head Circumference --      Peak Flow --      Pain Score 12/08/23 1256 5     Pain Loc --      Pain Education --      Exclude from Growth Chart --    No data found.  Updated Vital Signs BP 125/81 (BP Location: Right Arm)   Pulse 73   Temp 97.7 F (36.5 C) (Oral)   Resp 16   SpO2 97%    Physical Exam Vitals and nursing note reviewed.  Constitutional:      General: He is not in acute distress.    Appearance: Normal appearance. He is well-developed. He is not ill-appearing.  HENT:     Head: Normocephalic and atraumatic.     Nose: Nose normal.     Mouth/Throat:     Mouth: Mucous membranes are moist.     Pharynx: Oropharynx is clear.  Eyes:     General: No scleral icterus.    Conjunctiva/sclera: Conjunctivae normal.  Cardiovascular:     Rate and Rhythm: Normal rate and regular rhythm.  Pulmonary:     Effort: Pulmonary effort is normal. No respiratory distress.     Breath sounds: Normal breath sounds.  Musculoskeletal:     Cervical back: Neck supple.  Skin:    General: Skin is warm and dry.     Capillary Refill: Capillary refill takes less than 2 seconds.     Findings: No rash.  Neurological:     General: No focal deficit present.     Mental Status: He is alert. Mental status is at baseline.     Motor: No weakness.     Gait: Gait normal.  Psychiatric:        Mood and Affect: Mood normal.        Behavior: Behavior normal.      UC Treatments / Results  Labs (all labs ordered are listed, but only abnormal results are displayed) Labs Reviewed - No data to display  EKG   Radiology No results found.  Procedures Procedures (including critical care time)  Medications Ordered in UC Medications - No data to display  Initial Impression / Assessment and Plan / UC Course  I have reviewed the triage vital signs and the nursing notes.  Pertinent labs & imaging  results that were available during my care of the patient were reviewed by me and considered in my medical decision making (see chart for details).   58 y/o male presents for fatigue and leg pain following multiple tick bites a few days ago.  Vitals are normal and stable.  Patient is overall well-appearing.  He has no visible rashes.  Chest clear.  Heart regular rate and rhythm.  Discussed signs and symptoms of tickborne illness.  Advised him that is too soon to check him for antibodies as it takes longer for his body to make antibodies to take diseases such as Lyme disease and [facility] spotted fever.  He would like to be treated for the possibility of tickborne illness.  Doxycycline sent to pharmacy for the next few days.  Encouraged him to have a tickborne illness testing in about 6 weeks.  Advised him to return to ER sooner if he develops any new or worsening symptoms.    Final Clinical Impressions(s) / UC Diagnoses   Final diagnoses:  Other fatigue  Body aches  Tick bite, unspecified site, initial encounter     Discharge Instructions      - I am unsure if you have a tickborne illness.  It is too soon to test you as it takes several weeks for your body to develop antibodies to things like [facility] spotted fever and Lyme disease. - Given your concerns, I sent and antibiotic to the pharmacy.  I still suggest you get tick testing in about 6 weeks. - If any other symptoms emerge consider reevaluation.   ED Prescriptions     Medication Sig Dispense Auth. Provider   doxycycline (VIBRAMYCIN) 100 MG capsule Take 1 capsule (100 mg total) by mouth 2 (two) times daily for 10 days. 20 capsule Floydene Hy, PA-C      PDMP not reviewed this encounter.   Floydene Hy, PA-C 12/08/23 1410
